# Patient Record
Sex: Male | Born: 1940 | Race: White | Hispanic: No | Marital: Married | Smoking: Never smoker
Health system: Southern US, Community
[De-identification: ages and names within clinical notes are randomized; demographics above are authoritative.]

## PROBLEM LIST (undated history)

## (undated) DIAGNOSIS — N289 Disorder of kidney and ureter, unspecified: Secondary | ICD-10-CM

## (undated) DIAGNOSIS — K5792 Diverticulitis of intestine, part unspecified, without perforation or abscess without bleeding: Secondary | ICD-10-CM

## (undated) HISTORY — PX: COLON RESECTION: SHX5231

## (undated) HISTORY — PX: COLON SURGERY: SHX602

## (undated) HISTORY — PX: CYSTOSCOPY WITH INSERTION OF UROLIFT: SHX6678

## (undated) HISTORY — PX: LITHOTRIPSY: SUR834

---

## 2019-12-29 ENCOUNTER — Emergency Department (HOSPITAL_BASED_OUTPATIENT_CLINIC_OR_DEPARTMENT_OTHER): Payer: Medicare Other

## 2019-12-29 ENCOUNTER — Inpatient Hospital Stay (HOSPITAL_BASED_OUTPATIENT_CLINIC_OR_DEPARTMENT_OTHER)
Admission: EM | Admit: 2019-12-29 | Discharge: 2020-01-01 | DRG: 661 | Disposition: A | Discharge status: Home / Self Care | Payer: Medicare Other | Attending: Internal Medicine | Admitting: Internal Medicine

## 2019-12-29 ENCOUNTER — Other Ambulatory Visit: Payer: Self-pay

## 2019-12-29 ENCOUNTER — Encounter (HOSPITAL_BASED_OUTPATIENT_CLINIC_OR_DEPARTMENT_OTHER): Payer: Self-pay

## 2019-12-29 DIAGNOSIS — Z87442 Personal history of urinary calculi: Secondary | ICD-10-CM

## 2019-12-29 DIAGNOSIS — N132 Hydronephrosis with renal and ureteral calculous obstruction: Secondary | ICD-10-CM | POA: Diagnosis not present

## 2019-12-29 DIAGNOSIS — I129 Hypertensive chronic kidney disease with stage 1 through stage 4 chronic kidney disease, or unspecified chronic kidney disease: Secondary | ICD-10-CM | POA: Diagnosis present

## 2019-12-29 DIAGNOSIS — N281 Cyst of kidney, acquired: Secondary | ICD-10-CM | POA: Diagnosis present

## 2019-12-29 DIAGNOSIS — Z20822 Contact with and (suspected) exposure to covid-19: Secondary | ICD-10-CM | POA: Diagnosis present

## 2019-12-29 DIAGNOSIS — N200 Calculus of kidney: Secondary | ICD-10-CM

## 2019-12-29 DIAGNOSIS — N1330 Unspecified hydronephrosis: Secondary | ICD-10-CM

## 2019-12-29 DIAGNOSIS — I10 Essential (primary) hypertension: Secondary | ICD-10-CM | POA: Diagnosis present

## 2019-12-29 DIAGNOSIS — Z79899 Other long term (current) drug therapy: Secondary | ICD-10-CM

## 2019-12-29 DIAGNOSIS — N184 Chronic kidney disease, stage 4 (severe): Secondary | ICD-10-CM | POA: Diagnosis present

## 2019-12-29 DIAGNOSIS — N179 Acute kidney failure, unspecified: Secondary | ICD-10-CM | POA: Diagnosis present

## 2019-12-29 DIAGNOSIS — N400 Enlarged prostate without lower urinary tract symptoms: Secondary | ICD-10-CM | POA: Diagnosis present

## 2019-12-29 HISTORY — DX: Diverticulitis of intestine, part unspecified, without perforation or abscess without bleeding: K57.92

## 2019-12-29 HISTORY — DX: Disorder of kidney and ureter, unspecified: N28.9

## 2019-12-29 LAB — URINALYSIS, MICROSCOPIC (REFLEX)

## 2019-12-29 LAB — URINALYSIS, ROUTINE W REFLEX MICROSCOPIC
Bilirubin Urine: NEGATIVE
Glucose, UA: NEGATIVE mg/dL
Ketones, ur: NEGATIVE mg/dL
Leukocytes,Ua: NEGATIVE
Nitrite: NEGATIVE
Protein, ur: 100 mg/dL — AB
Specific Gravity, Urine: 1.02 (ref 1.005–1.030)
pH: 6 (ref 5.0–8.0)

## 2019-12-29 LAB — COMPREHENSIVE METABOLIC PANEL
ALT: 12 U/L (ref 0–44)
AST: 16 U/L (ref 15–41)
Albumin: 3.7 g/dL (ref 3.5–5.0)
Alkaline Phosphatase: 90 U/L (ref 38–126)
Anion gap: 14 (ref 5–15)
BUN: 45 mg/dL — ABNORMAL HIGH (ref 8–23)
CO2: 22 mmol/L (ref 22–32)
Calcium: 9.4 mg/dL (ref 8.9–10.3)
Chloride: 99 mmol/L (ref 98–111)
Creatinine, Ser: 4.73 mg/dL — ABNORMAL HIGH (ref 0.61–1.24)
GFR, Estimated: 12 mL/min — ABNORMAL LOW (ref >60)
Glucose, Bld: 119 mg/dL — ABNORMAL HIGH (ref 70–99)
Potassium: 3.7 mmol/L (ref 3.5–5.1)
Sodium: 135 mmol/L (ref 135–145)
Total Bilirubin: 0.5 mg/dL (ref 0.3–1.2)
Total Protein: 7.8 g/dL (ref 6.5–8.1)

## 2019-12-29 LAB — CBC
HCT: 34.6 % — ABNORMAL LOW (ref 39.0–52.0)
Hemoglobin: 11.1 g/dL — ABNORMAL LOW (ref 13.0–17.0)
MCH: 26.6 pg (ref 26.0–34.0)
MCHC: 32.1 g/dL (ref 30.0–36.0)
MCV: 83 fL (ref 80.0–100.0)
Platelets: 407 10*3/uL — ABNORMAL HIGH (ref 150–400)
RBC: 4.17 MIL/uL — ABNORMAL LOW (ref 4.22–5.81)
RDW: 14.3 % (ref 11.5–15.5)
WBC: 15.4 10*3/uL — ABNORMAL HIGH (ref 4.0–10.5)
nRBC: 0 % (ref 0.0–0.2)

## 2019-12-29 LAB — LIPASE, BLOOD: Lipase: 15 U/L (ref 11–51)

## 2019-12-29 MED ORDER — SODIUM CHLORIDE 0.9 % IV BOLUS
1000.0000 mL | Freq: Once | INTRAVENOUS | Status: AC
  Administered 2019-12-29: 1000 mL via INTRAVENOUS

## 2019-12-29 MED ORDER — MORPHINE SULFATE (PF) 4 MG/ML IV SOLN
4.0000 mg | Freq: Once | INTRAVENOUS | Status: AC
  Administered 2019-12-29: 4 mg via INTRAVENOUS
  Filled 2019-12-29: qty 1

## 2019-12-29 MED ORDER — ONDANSETRON HCL 4 MG/2ML IJ SOLN
4.0000 mg | Freq: Once | INTRAMUSCULAR | Status: AC
  Administered 2019-12-29: 4 mg via INTRAVENOUS
  Filled 2019-12-29: qty 2

## 2019-12-29 NOTE — ED Provider Notes (Signed)
[facility] Provider Note   CSN: [id number] Arrival date & time: 12/29/19  2039     History Chief Complaint  Patient presents with  . Abdominal Pain    Leslie Reese is a 79 y.o. male.  79 yo M with a chief complaints of right-sided abdominal pain.  Started last night as a dull ache.  Had some nausea and vomiting with it.  Persisted throughout this morning.  Worse with eating.  Radiates from the right flank down into the right lower portion of the abdomen.  No fevers.  Had a loose stool just prior to this because he had been constipated for couple days and took a laxative.  The history is provided by the patient.  Abdominal Pain Pain location:  R flank Pain quality: sharp and shooting   Pain radiates to:  Does not radiate Pain severity:  Moderate Onset quality:  Gradual Duration:  1 day Timing:  Constant Progression:  Worsening Chronicity:  New Context: eating   Relieved by:  Nothing Worsened by:  Eating Ineffective treatments:  None tried Associated symptoms: nausea and vomiting   Associated symptoms: no chest pain, no chills, no diarrhea, no fever and no shortness of breath        Past Medical History:  Diagnosis Date  . Diverticulitis   . Renal disorder    kidney stones    There are no problems to display for this patient.   Past Surgical History:  Procedure Laterality Date  . COLON RESECTION         History reviewed. No pertinent family history.  Social History   Tobacco Use  . Smoking status: Never Smoker  Substance Use Topics  . Alcohol use: Never  . Drug use: Never    Home Medications Prior to Admission medications   Medication Sig Start Date End Date Taking? Authorizing Provider  amLODipine (NORVASC) 5 MG tablet Take by mouth.   Yes [provider]  calcitRIOL (ROCALTROL) 0.25 MCG capsule Take 0.25 mcg by mouth daily. Mon, Wed, Friday   Yes [provider]  tamsulosin (FLOMAX) 0.4 MG CAPS  capsule Take by mouth.   Yes [provider]  torsemide (DEMADEX) 20 MG tablet Take 20 mg by mouth daily.    [provider]    Allergies    Patient has no known allergies.  Review of Systems   Review of Systems  Constitutional: Negative for chills and fever.  HENT: Negative for congestion and facial swelling.   Eyes: Negative for discharge and visual disturbance.  Respiratory: Negative for shortness of breath.   Cardiovascular: Negative for chest pain and palpitations.  Gastrointestinal: Positive for abdominal pain, nausea and vomiting. Negative for diarrhea.  Musculoskeletal: Negative for arthralgias and myalgias.  Skin: Negative for color change and rash.  Neurological: Negative for tremors, syncope and headaches.  Psychiatric/Behavioral: Negative for confusion and dysphoric mood.    Physical Exam Updated Vital Signs BP (!) 152/85   Pulse 77   Temp 97.6 F (36.4 C) (Oral)   Resp 18   Ht 5\' 11"  (1.803 m)   Wt 86.2 kg   SpO2 98%   BMI 26.50 kg/m   Physical Exam Vitals and nursing note reviewed.  Constitutional:      Appearance: He is well-developed and well-nourished.  HENT:     Head: Normocephalic and atraumatic.  Eyes:     Extraocular Movements: EOM normal.     Pupils: Pupils are equal, round, and reactive to light.  Neck:     Vascular: No JVD.  Cardiovascular:     Rate and Rhythm: Normal rate and regular rhythm.     Heart sounds: No murmur heard. No friction rub. No gallop.   Pulmonary:     Effort: No respiratory distress.     Breath sounds: No wheezing.  Abdominal:     General: There is no distension.     Tenderness: There is no abdominal tenderness. There is no guarding or rebound.     Comments: No significant pain to the abdomen.   Musculoskeletal:        General: Normal range of motion.     Cervical back: Normal range of motion and neck supple.  Skin:    Coloration: Skin is not pale.     Findings: No rash.  Neurological:      Mental Status: He is alert and oriented to person, place, and time.  Psychiatric:        Mood and Affect: Mood and affect normal.        Behavior: Behavior normal.     ED Results / Procedures / Treatments   Labs (all labs ordered are listed, but only abnormal results are displayed) Labs Reviewed  COMPREHENSIVE METABOLIC PANEL - Abnormal; Notable for the following components:      Result Value   Glucose, Bld 119 (*)    BUN 45 (*)    Creatinine, Ser 4.73 (*)    GFR, Estimated 12 (*)    All other components within normal limits  CBC - Abnormal; Notable for the following components:   WBC 15.4 (*)    RBC 4.17 (*)    Hemoglobin 11.1 (*)    HCT 34.6 (*)    Platelets 407 (*)    All other components within normal limits  URINALYSIS, ROUTINE W REFLEX MICROSCOPIC - Abnormal; Notable for the following components:   APPearance HAZY (*)    Hgb urine dipstick SMALL (*)    Protein, ur 100 (*)    All other components within normal limits  URINALYSIS, MICROSCOPIC (REFLEX) - Abnormal; Notable for the following components:   Bacteria, UA FEW (*)    All other components within normal limits  RESP PANEL BY RT-PCR (FLU A&B, COVID) ARPGX2  LIPASE, BLOOD    EKG None  Radiology CT ABDOMEN PELVIS WO CONTRAST  Result Date: 12/29/2019 CLINICAL DATA:  Right-sided abdominal pain. EXAM: CT ABDOMEN AND PELVIS WITHOUT CONTRAST TECHNIQUE: Multidetector CT imaging of the abdomen and pelvis was performed following the standard protocol without IV contrast. COMPARISON:  August 24, 2014 FINDINGS: Lower chest: No acute abnormality. Hepatobiliary: No focal liver abnormality is seen. No gallstones, gallbladder wall thickening, or biliary dilatation. Pancreas: Unremarkable. No pancreatic ductal dilatation or surrounding inflammatory changes. Spleen: Normal in size without focal abnormality. Adrenals/Urinary Tract: Adrenal glands are unremarkable. Kidneys are normal in size. A 1.7 cm x 1.2 cm cyst is seen along the  anterolateral aspect of the mid right kidney. An additional 1.1 cm x 0.8 cm cyst is seen along the anterior aspect of the mid left kidney. A 3 mm obstructing renal stone is seen at the right UVJ. Moderate severity right-sided hydronephrosis and hydroureter are noted. Bladder is unremarkable. Stomach/Bowel: There is a small hiatal hernia. The appendix is not clearly identified. No evidence of bowel wall thickening, distention, or inflammatory changes. Noninflamed diverticula are seen throughout the large bowel. Vascular/Lymphatic: Aortic atherosclerosis. No enlarged abdominal or pelvic lymph nodes. 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 Other: No abdominal wall hernia or abnormality. No abdominopelvic ascites. Musculoskeletal: There is grade 1 anterolisthesis of the L5 vertebral body on S1. IMPRESSION: 1. 3 mm obstructing renal stone at the right UVJ. 2. Small hiatal hernia. 3. Colonic diverticulosis. 4. 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 5. 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 6. Aortic atherosclerosis. Aortic Atherosclerosis (ICD10-I70.0). Electronically Signed   By: Virgina Norfolk M.D.   On: 12/29/2019 23:34    Procedures Procedures (including critical care time)  Medications Ordered in ED Medications  morphine 4 MG/ML injection 4 mg (4 mg Intravenous Given 12/29/19 2252)  ondansetron (ZOFRAN) injection 4 mg (4 mg Intravenous Given 12/29/19 2251)  sodium chloride 0.9 % bolus 1,000 mL (1,000 mLs Intravenous New Bag/Given 12/29/19 2254)    ED Course  I have reviewed the triage vital signs and the nursing notes.  Pertinent labs & imaging results that were available during my care of the patient were reviewed by me and considered in my medical decision making (see chart for details).    MDM Rules/Calculators/A&P                          79 yo M with a cc of R sided abdominal pain.  Emesis last night.  No fevers.   Labwork with aki.  Will give bolus pain meds, CT.     Lab work with an acute kidney injury.  Looks like his baseline creatinine as far as I can see is usually about 1.8 or 9.  Creatinine today is greater than 4.  I discussed this with the patient.  He does concur that is higher than his normal.  CT scan viewed by me with right-sided hydronephrosis.  Stone noted at the UVJ.  Patient care signed out to Dr. Laverta Baltimore, please see his note for further details care in the ED.  Likely with AKI and obstructive uropathy will require admission.  The patients results and plan were reviewed and discussed.   Any x-rays performed were independently reviewed by myself.   Differential diagnosis were considered with the presenting HPI.  Medications  morphine 4 MG/ML injection 4 mg (4 mg Intravenous Given 12/29/19 2252)  ondansetron (ZOFRAN) injection 4 mg (4 mg Intravenous Given 12/29/19 2251)  sodium chloride 0.9 % bolus 1,000 mL (1,000 mLs Intravenous New Bag/Given 12/29/19 2254)    Vitals:   12/29/19 2050 12/29/19 2054 12/29/19 2152 12/29/19 2325  BP: 131/88   (!) 152/85  Pulse: 84   77  Resp: 18   18  Temp:   98.4 F (36.9 C) 97.6 F (36.4 C)  TempSrc:   Oral Oral  SpO2: 98%   98%  Weight:  86.2 kg    Height:  5\' 11"  (1.803 m)      Final diagnoses:  AKI (acute kidney injury) ([place])  Nephrolithiasis    Admission/ observation were discussed with the admitting physician, patient and/or family and they are comfortable with the plan.   Final Clinical Impression(s) / ED Diagnoses Final diagnoses:  AKI (acute kidney injury) ([place])  Nephrolithiasis    Rx / DC Orders ED Discharge Orders    None       Deno Etienne, DO 12/29/19 2343

## 2019-12-29 NOTE — ED Triage Notes (Signed)
Pt complaining of right abdomen and flank pain since yesterday. Hx of kidney stones. States ate large meal last night and started after. Emesis x 3 last night. Pain resolved this am, then returned after eating. Hx of colon resection & diverticulitis. Last normal BM 12/24.

## 2019-12-30 ENCOUNTER — Encounter (HOSPITAL_COMMUNITY)
Admission: EM | Disposition: A | Discharge status: Home / Self Care | Payer: Self-pay | Source: Home / Self Care | Attending: Internal Medicine

## 2019-12-30 ENCOUNTER — Inpatient Hospital Stay (HOSPITAL_COMMUNITY): Payer: Medicare Other

## 2019-12-30 ENCOUNTER — Inpatient Hospital Stay (HOSPITAL_COMMUNITY): Payer: Medicare Other | Admitting: Anesthesiology

## 2019-12-30 ENCOUNTER — Encounter (HOSPITAL_COMMUNITY): Payer: Self-pay | Admitting: Family Medicine

## 2019-12-30 DIAGNOSIS — N281 Cyst of kidney, acquired: Secondary | ICD-10-CM | POA: Diagnosis not present

## 2019-12-30 DIAGNOSIS — N184 Chronic kidney disease, stage 4 (severe): Secondary | ICD-10-CM | POA: Diagnosis not present

## 2019-12-30 DIAGNOSIS — Z79899 Other long term (current) drug therapy: Secondary | ICD-10-CM | POA: Diagnosis not present

## 2019-12-30 DIAGNOSIS — I129 Hypertensive chronic kidney disease with stage 1 through stage 4 chronic kidney disease, or unspecified chronic kidney disease: Secondary | ICD-10-CM | POA: Diagnosis not present

## 2019-12-30 DIAGNOSIS — Z87442 Personal history of urinary calculi: Secondary | ICD-10-CM | POA: Diagnosis not present

## 2019-12-30 DIAGNOSIS — N179 Acute kidney failure, unspecified: Secondary | ICD-10-CM | POA: Diagnosis present

## 2019-12-30 DIAGNOSIS — N400 Enlarged prostate without lower urinary tract symptoms: Secondary | ICD-10-CM | POA: Diagnosis not present

## 2019-12-30 DIAGNOSIS — N132 Hydronephrosis with renal and ureteral calculous obstruction: Secondary | ICD-10-CM | POA: Diagnosis not present

## 2019-12-30 DIAGNOSIS — I10 Essential (primary) hypertension: Secondary | ICD-10-CM | POA: Diagnosis not present

## 2019-12-30 DIAGNOSIS — Z20822 Contact with and (suspected) exposure to covid-19: Secondary | ICD-10-CM | POA: Diagnosis not present

## 2019-12-30 DIAGNOSIS — N200 Calculus of kidney: Secondary | ICD-10-CM | POA: Diagnosis present

## 2019-12-30 HISTORY — PX: CYSTOSCOPY WITH RETROGRADE PYELOGRAM, URETEROSCOPY AND STENT PLACEMENT: SHX5789

## 2019-12-30 LAB — RESP PANEL BY RT-PCR (FLU A&B, COVID) ARPGX2
Influenza A by PCR: NEGATIVE
Influenza B by PCR: NEGATIVE
SARS Coronavirus 2 by RT PCR: NEGATIVE

## 2019-12-30 SURGERY — CYSTOURETEROSCOPY, WITH RETROGRADE PYELOGRAM AND STENT INSERTION
Anesthesia: General | Laterality: Right

## 2019-12-30 MED ORDER — DEXAMETHASONE SODIUM PHOSPHATE 10 MG/ML IJ SOLN
INTRAMUSCULAR | Status: AC
  Filled 2019-12-30: qty 1

## 2019-12-30 MED ORDER — CEFAZOLIN SODIUM-DEXTROSE 2-4 GM/100ML-% IV SOLN
INTRAVENOUS | Status: AC
  Filled 2019-12-30: qty 100

## 2019-12-30 MED ORDER — PROPOFOL 10 MG/ML IV BOLUS
INTRAVENOUS | Status: AC
  Filled 2019-12-30: qty 20

## 2019-12-30 MED ORDER — FENTANYL CITRATE (PF) 250 MCG/5ML IJ SOLN
INTRAMUSCULAR | Status: AC
  Filled 2019-12-30: qty 5

## 2019-12-30 MED ORDER — DEXAMETHASONE SODIUM PHOSPHATE 10 MG/ML IJ SOLN
INTRAMUSCULAR | Status: DC | PRN
  Administered 2019-12-30: 10 mg via INTRAVENOUS

## 2019-12-30 MED ORDER — 0.9 % SODIUM CHLORIDE (POUR BTL) OPTIME
TOPICAL | Status: DC | PRN
  Administered 2019-12-30: 1000 mL

## 2019-12-30 MED ORDER — PROPOFOL 10 MG/ML IV BOLUS
INTRAVENOUS | Status: DC | PRN
  Administered 2019-12-30: 150 mg via INTRAVENOUS

## 2019-12-30 MED ORDER — MORPHINE SULFATE (PF) 4 MG/ML IV SOLN
4.0000 mg | Freq: Once | INTRAVENOUS | Status: AC
  Administered 2019-12-30: 4 mg via INTRAVENOUS
  Filled 2019-12-30: qty 1

## 2019-12-30 MED ORDER — ONDANSETRON HCL 4 MG/2ML IJ SOLN
INTRAMUSCULAR | Status: AC
  Filled 2019-12-30: qty 2

## 2019-12-30 MED ORDER — AMLODIPINE BESYLATE 5 MG PO TABS
5.0000 mg | ORAL_TABLET | Freq: Every day | ORAL | Status: DC
Start: 2019-12-31 — End: 2020-01-01
  Administered 2019-12-31 – 2020-01-01 (×2): 5 mg via ORAL
  Filled 2019-12-30 (×2): qty 1

## 2019-12-30 MED ORDER — FENTANYL CITRATE (PF) 100 MCG/2ML IJ SOLN
INTRAMUSCULAR | Status: AC
  Filled 2019-12-30: qty 2

## 2019-12-30 MED ORDER — MORPHINE SULFATE (PF) 2 MG/ML IV SOLN: 2.0000 mg | INTRAVENOUS | Status: DC | PRN

## 2019-12-30 MED ORDER — TORSEMIDE 20 MG PO TABS
20.0000 mg | ORAL_TABLET | Freq: Every day | ORAL | Status: DC
  Administered 2019-12-31: 20 mg via ORAL
  Filled 2019-12-30: qty 1

## 2019-12-30 MED ORDER — EPHEDRINE SULFATE-NACL 50-0.9 MG/10ML-% IV SOSY
PREFILLED_SYRINGE | INTRAVENOUS | Status: DC | PRN
  Administered 2019-12-30: 10 mg via INTRAVENOUS

## 2019-12-30 MED ORDER — TAMSULOSIN HCL 0.4 MG PO CAPS
0.4000 mg | ORAL_CAPSULE | Freq: Every day | ORAL | Status: DC
  Administered 2019-12-31 – 2020-01-01 (×2): 0.4 mg via ORAL
  Filled 2019-12-30 (×2): qty 1

## 2019-12-30 MED ORDER — CEFAZOLIN SODIUM-DEXTROSE 2-4 GM/100ML-% IV SOLN
2.0000 g | Freq: Once | INTRAVENOUS | Status: AC
  Administered 2019-12-30: 2 g via INTRAVENOUS

## 2019-12-30 MED ORDER — LIDOCAINE HCL (PF) 2 % IJ SOLN
INTRAMUSCULAR | Status: AC
  Filled 2019-12-30: qty 5

## 2019-12-30 MED ORDER — ACETAMINOPHEN 325 MG PO TABS: 650.0000 mg | ORAL_TABLET | Freq: Four times a day (QID) | ORAL | Status: DC | PRN

## 2019-12-30 MED ORDER — ACETAMINOPHEN 650 MG RE SUPP: 650.0000 mg | Freq: Four times a day (QID) | RECTAL | Status: DC | PRN

## 2019-12-30 MED ORDER — CHLORHEXIDINE GLUCONATE 0.12 % MT SOLN
15.0000 mL | OROMUCOSAL | Status: AC
  Administered 2019-12-30: 15 mL via OROMUCOSAL

## 2019-12-30 MED ORDER — IOHEXOL 300 MG/ML  SOLN
INTRAMUSCULAR | Status: DC | PRN
  Administered 2019-12-30: 12 mL

## 2019-12-30 MED ORDER — SODIUM CHLORIDE 0.9 % IV SOLN: INTRAVENOUS | Status: DC

## 2019-12-30 MED ORDER — MIDAZOLAM HCL 2 MG/2ML IJ SOLN
INTRAMUSCULAR | Status: AC
  Filled 2019-12-30: qty 2

## 2019-12-30 MED ORDER — SODIUM CHLORIDE 0.9 % IR SOLN
Status: DC | PRN
  Administered 2019-12-30: 3000 mL

## 2019-12-30 MED ORDER — TAMSULOSIN HCL 0.4 MG PO CAPS
0.4000 mg | ORAL_CAPSULE | Freq: Once | ORAL | Status: AC
  Administered 2019-12-30: 0.4 mg via ORAL
  Filled 2019-12-30: qty 1

## 2019-12-30 MED ORDER — LIDOCAINE 2% (20 MG/ML) 5 ML SYRINGE
INTRAMUSCULAR | Status: DC | PRN
  Administered 2019-12-30: 100 mg via INTRAVENOUS

## 2019-12-30 MED ORDER — ONDANSETRON HCL 4 MG/2ML IJ SOLN: 4.0000 mg | Freq: Four times a day (QID) | INTRAMUSCULAR | Status: DC | PRN

## 2019-12-30 MED ORDER — ONDANSETRON HCL 4 MG/2ML IJ SOLN: 4.0000 mg | Freq: Once | INTRAMUSCULAR | Status: DC | PRN

## 2019-12-30 MED ORDER — OXYCODONE HCL 5 MG PO TABS: 5.0000 mg | ORAL_TABLET | Freq: Once | ORAL | Status: DC | PRN

## 2019-12-30 MED ORDER — FENTANYL CITRATE (PF) 100 MCG/2ML IJ SOLN
25.0000 ug | INTRAMUSCULAR | Status: DC | PRN
Start: 2019-12-30 — End: 2019-12-30

## 2019-12-30 MED ORDER — OXYCODONE HCL 5 MG/5ML PO SOLN: 5.0000 mg | Freq: Once | ORAL | Status: DC | PRN

## 2019-12-30 SURGICAL SUPPLY — 21 items
BAG URO CATCHER STRL LF (MISCELLANEOUS) ×2 IMPLANT
BASKET ZERO TIP NITINOL 2.4FR (BASKET) IMPLANT
CATH URET 5FR 28IN OPEN ENDED (CATHETERS) ×2 IMPLANT
CLOTH BEACON ORANGE TIMEOUT ST (SAFETY) ×2 IMPLANT
FIBER LASER MOSES 200 DFL (Laser) IMPLANT
GLOVE BIOGEL M 7.0 STRL (GLOVE) ×2 IMPLANT
GOWN STRL REUS W/TWL LRG LVL3 (GOWN DISPOSABLE) ×2 IMPLANT
GUIDEWIRE STR DUAL SENSOR (WIRE) ×4 IMPLANT
GUIDEWIRE ZIPWRE .038 STRAIGHT (WIRE) IMPLANT
IV NS 1000ML (IV SOLUTION) ×1
IV NS 1000ML BAXH (IV SOLUTION) ×1 IMPLANT
KIT TURNOVER KIT A (KITS) IMPLANT
LASER FIB FLEXIVA PULSE ID 365 (Laser) IMPLANT
MANIFOLD NEPTUNE II (INSTRUMENTS) ×2 IMPLANT
PACK CYSTO (CUSTOM PROCEDURE TRAY) ×2 IMPLANT
SHEATH URETERAL 12FRX35CM (MISCELLANEOUS) IMPLANT
STENT URET 6FRX26 CONTOUR (STENTS) ×2 IMPLANT
TRACTIP FLEXIVA PULS ID 200XHI (Laser) IMPLANT
TRACTIP FLEXIVA PULSE ID 200 (Laser)
TUBING CONNECTING 10 (TUBING) ×2 IMPLANT
TUBING UROLOGY SET (TUBING) ×2 IMPLANT

## 2019-12-30 NOTE — Anesthesia Preprocedure Evaluation (Signed)
Anesthesia Evaluation  Patient identified by MRN, date of birth, ID band Patient awake    Reviewed: Allergy & Precautions, H&P , NPO status , Patient's Chart, lab work & pertinent test results  Airway Mallampati: II  TM Distance: >3 FB Neck ROM: Full    Dental no notable dental hx.    Pulmonary neg pulmonary ROS,    Pulmonary exam normal breath sounds clear to auscultation       Cardiovascular negative cardio ROS Normal cardiovascular exam Rhythm:Regular Rate:Normal     Neuro/Psych negative neurological ROS  negative psych ROS   GI/Hepatic negative GI ROS, Neg liver ROS,   Endo/Other  negative endocrine ROS  Renal/GU Renal InsufficiencyRenal disease  negative genitourinary   Musculoskeletal negative musculoskeletal ROS (+)   Abdominal   Peds negative pediatric ROS (+)  Hematology  (+) anemia ,   Anesthesia Other Findings   Reproductive/Obstetrics negative OB ROS                             Anesthesia Physical Anesthesia Plan  ASA: III  Anesthesia Plan: General   Post-op Pain Management:    Induction: Intravenous  PONV Risk Score and Plan: 2 and Ondansetron, Dexamethasone and Treatment may vary due to age or medical condition  Airway Management Planned: LMA  Additional Equipment:   Intra-op Plan:   Post-operative Plan: Extubation in OR  Informed Consent: I have reviewed the patients History and Physical, chart, labs and discussed the procedure including the risks, benefits and alternatives for the proposed anesthesia with the patient or authorized representative who has indicated his/her understanding and acceptance.     Dental advisory given  Plan Discussed with: CRNA and Surgeon  Anesthesia Plan Comments:         Anesthesia Quick Evaluation

## 2019-12-30 NOTE — Consult Note (Signed)
Urology Consult   Physician requesting consult: Nanda Quinton, MD  Reason for consult: Right ureteral stone, AKI  History of Present Illness: Alcide Memoli is a 79 y.o. male who presented to the ED with acute onset of right flank pain.  He denies fevers or chills.  He denies dysuria or hematuria.  He has some urgency but no frequency.  He does have a history of urolithiasis and underwent ESWL in [place] by Dr. Harlow Asa about a month ago.   CT A/P 12/29/2019 revealed a 3 mm obstructing renal stone at the right UVJ with severe right-sided hydronephrosis.  Also evident or bilateral simple appearing renal cyst.  He is afebrile, white blood cell count 15.4, urinalysis without sign of infection, urine culture pending, creatinine 4.73 from an unknown baseline.   Past Medical History:  Diagnosis Date  . Diverticulitis   . Renal disorder    kidney stones    Past Surgical History:  Procedure Laterality Date  . COLON RESECTION      Current Hospital Medications:  Home Meds:  No current facility-administered medications on file prior to encounter.   Current Outpatient Medications on File Prior to Encounter  Medication Sig Dispense Refill  . albuterol (VENTOLIN HFA) 108 (90 Base) MCG/ACT inhaler Inhale 1-2 puffs into the lungs every 6 (six) hours as needed for wheezing or shortness of breath.    Marland Kitchen amLODipine (NORVASC) 5 MG tablet Take by mouth.    . calcitRIOL (ROCALTROL) 0.25 MCG capsule Take 0.25 mcg by mouth daily. Mon, Wed, Friday    . CALCIUM ASCORBATE PO Take 1 tablet by mouth. Every Monday, Wednesday, Friday    . tamsulosin (FLOMAX) 0.4 MG CAPS capsule Take by mouth.    . torsemide (DEMADEX) 20 MG tablet Take 20 mg by mouth daily.       Scheduled Meds: Continuous Infusions: PRN Meds:.acetaminophen, morphine injection, ondansetron (ZOFRAN) IV  Allergies: No Known Allergies  History reviewed. No pertinent family history.  Social History:  reports that he has never smoked. He  does not have any smokeless tobacco history on file. He reports that he does not drink alcohol and does not use drugs.  ROS: A complete review of systems was performed.  All systems are negative except for pertinent findings as noted.  Physical Exam:  Vital signs in last 24 hours: Temp:  [97.6 F (36.4 C)-98.4 F (36.9 C)] 98.2 F (36.8 C) (12/27 0323) Pulse Rate:  [63-84] 66 (12/27 0323) Resp:  [18] 18 (12/27 0323) BP: (128-161)/(81-94) 132/81 (12/27 0323) SpO2:  [93 %-99 %] 95 % (12/27 0323) Weight:  [86.2 kg] 86.2 kg (12/26 2054) Constitutional:  Alert and oriented, No acute distress Cardiovascular: Regular rate and rhythm Respiratory: Normal respiratory effort, Lungs clear bilaterally GI: Abdomen is soft, nontender, nondistended, no abdominal masses GU: No CVA tenderness Neurologic: Grossly intact, no focal deficits Psychiatric: Normal mood and affect  Laboratory Data:  Recent Labs    12/29/19 2101  WBC 15.4*  HGB 11.1*  HCT 34.6*  PLT 407*    Recent Labs    12/29/19 2101  NA 135  K 3.7  CL 99  GLUCOSE 119*  BUN 45*  CALCIUM 9.4  CREATININE 4.73*     Results for orders placed or performed during the hospital encounter of 12/29/19 (from the past 24 hour(s))  Lipase, blood     Status: None   Collection Time: 12/29/19  9:01 PM  Result Value Ref Range   Lipase 15 11 - 51 U/L  Comprehensive metabolic panel     Status: Abnormal   Collection Time: 12/29/19  9:01 PM  Result Value Ref Range   Sodium 135 135 - 145 mmol/L   Potassium 3.7 3.5 - 5.1 mmol/L   Chloride 99 98 - 111 mmol/L   CO2 22 22 - 32 mmol/L   Glucose, Bld 119 (H) 70 - 99 mg/dL   BUN 45 (H) 8 - 23 mg/dL   Creatinine, Ser 4.73 (H) 0.61 - 1.24 mg/dL   Calcium 9.4 8.9 - 10.3 mg/dL   Total Protein 7.8 6.5 - 8.1 g/dL   Albumin 3.7 3.5 - 5.0 g/dL   AST 16 15 - 41 U/L   ALT 12 0 - 44 U/L   Alkaline Phosphatase 90 38 - 126 U/L   Total Bilirubin 0.5 0.3 - 1.2 mg/dL   GFR, Estimated 12 (L) >60  mL/min   Anion gap 14 5 - 15  CBC     Status: Abnormal   Collection Time: 12/29/19  9:01 PM  Result Value Ref Range   WBC 15.4 (H) 4.0 - 10.5 K/uL   RBC 4.17 (L) 4.22 - 5.81 MIL/uL   Hemoglobin 11.1 (L) 13.0 - 17.0 g/dL   HCT 34.6 (L) 39.0 - 52.0 %   MCV 83.0 80.0 - 100.0 fL   MCH 26.6 26.0 - 34.0 pg   MCHC 32.1 30.0 - 36.0 g/dL   RDW 14.3 11.5 - 15.5 %   Platelets 407 (H) 150 - 400 K/uL   nRBC 0.0 0.0 - 0.2 %  Urinalysis, Routine w reflex microscopic Urine, Clean Catch     Status: Abnormal   Collection Time: 12/29/19  9:01 PM  Result Value Ref Range   Color, Urine YELLOW YELLOW   APPearance HAZY (A) CLEAR   Specific Gravity, Urine 1.020 1.005 - 1.030   pH 6.0 5.0 - 8.0   Glucose, UA NEGATIVE NEGATIVE mg/dL   Hgb urine dipstick SMALL (A) NEGATIVE   Bilirubin Urine NEGATIVE NEGATIVE   Ketones, ur NEGATIVE NEGATIVE mg/dL   Protein, ur 100 (A) NEGATIVE mg/dL   Nitrite NEGATIVE NEGATIVE   Leukocytes,Ua NEGATIVE NEGATIVE  Urinalysis, Microscopic (reflex)     Status: Abnormal   Collection Time: 12/29/19  9:01 PM  Result Value Ref Range   RBC / HPF 0-5 0 - 5 RBC/hpf   WBC, UA 0-5 0 - 5 WBC/hpf   Bacteria, UA FEW (A) NONE SEEN   Squamous Epithelial / LPF 0-5 0 - 5   Mucus PRESENT    Hyaline Casts, UA PRESENT    Granular Casts, UA PRESENT   Resp Panel by RT-PCR (Flu A&B, Covid) Nasopharyngeal Swab     Status: None   Collection Time: 12/29/19 11:14 PM   Specimen: Nasopharyngeal Swab; Nasopharyngeal(NP) swabs in vial transport medium  Result Value Ref Range   SARS Coronavirus 2 by RT PCR NEGATIVE NEGATIVE   Influenza A by PCR NEGATIVE NEGATIVE   Influenza B by PCR NEGATIVE NEGATIVE   Recent Results (from the past 240 hour(s))  Resp Panel by RT-PCR (Flu A&B, Covid) Nasopharyngeal Swab     Status: None   Collection Time: 12/29/19 11:14 PM   Specimen: Nasopharyngeal Swab; Nasopharyngeal(NP) swabs in vial transport medium  Result Value Ref Range Status   SARS Coronavirus 2 by RT  PCR NEGATIVE NEGATIVE Final    Comment: (NOTE) SARS-CoV-2 target nucleic acids are NOT DETECTED.  The SARS-CoV-2 RNA is generally detectable in upper respiratory specimens during the acute phase of  infection. The lowest concentration of SARS-CoV-2 viral copies this assay can detect is 138 copies/mL. A negative result does not preclude SARS-Cov-2 infection and should not be used as the sole basis for treatment or other patient management decisions. A negative result may occur with  improper specimen collection/handling, submission of specimen other than nasopharyngeal swab, presence of viral mutation(s) within the areas targeted by this assay, and inadequate number of viral copies(<138 copies/mL). A negative result must be combined with clinical observations, patient history, and epidemiological information. The expected result is Negative.  Fact Sheet for Patients:  EntrepreneurPulse.com.au  Fact Sheet for Healthcare Providers:  IncredibleEmployment.be  This test is no t yet approved or cleared by the [place] FDA and  has been authorized for detection and/or diagnosis of SARS-CoV-2 by FDA under an Emergency Use Authorization (EUA). This EUA will remain  in effect (meaning this test can be used) for the duration of the COVID-19 declaration under Section 564(b)(1) of the Act, 21 U.S.C.section 360bbb-3(b)(1), unless the authorization is terminated  or revoked sooner.       Influenza A by PCR NEGATIVE NEGATIVE Final   Influenza B by PCR NEGATIVE NEGATIVE Final    Comment: (NOTE) The Xpert Xpress SARS-CoV-2/FLU/RSV plus assay is intended as an aid in the diagnosis of influenza from Nasopharyngeal swab specimens and should not be used as a sole basis for treatment. Nasal washings and aspirates are unacceptable for Xpert Xpress SARS-CoV-2/FLU/RSV testing.  Fact Sheet for Patients: EntrepreneurPulse.com.au  Fact Sheet for  Healthcare Providers: IncredibleEmployment.be  This test is not yet approved or cleared by the [place] FDA and has been authorized for detection and/or diagnosis of SARS-CoV-2 by FDA under an Emergency Use Authorization (EUA). This EUA will remain in effect (meaning this test can be used) for the duration of the COVID-19 declaration under Section 564(b)(1) of the Act, 21 U.S.C. section 360bbb-3(b)(1), unless the authorization is terminated or revoked.  Performed at [facility], [address], [place], [place] [address]     Renal Function: Recent Labs    12/29/19 2101  CREATININE 4.73*   Estimated Creatinine Clearance: 13.9 mL/min (A) (by C-G formula based on SCr of 4.73 mg/dL (H)).  Radiologic Imaging: CT ABDOMEN PELVIS WO CONTRAST  Result Date: 12/29/2019 CLINICAL DATA:  Right-sided abdominal pain. EXAM: CT ABDOMEN AND PELVIS WITHOUT CONTRAST TECHNIQUE: Multidetector CT imaging of the abdomen and pelvis was performed following the standard protocol without IV contrast. COMPARISON:  August 24, 2014 FINDINGS: Lower chest: No acute abnormality. Hepatobiliary: No focal liver abnormality is seen. No gallstones, gallbladder wall thickening, or biliary dilatation. Pancreas: Unremarkable. No pancreatic ductal dilatation or surrounding inflammatory changes. Spleen: Normal in size without focal abnormality. Adrenals/Urinary Tract: Adrenal glands are unremarkable. Kidneys are normal in size. A 1.7 cm x 1.2 cm cyst is seen along the anterolateral aspect of the mid right kidney. An additional 1.1 cm x 0.8 cm cyst is seen along the anterior aspect of the mid left kidney. A 3 mm obstructing renal stone is seen at the right UVJ. Moderate severity right-sided hydronephrosis and hydroureter are noted. Bladder is unremarkable. Stomach/Bowel: There is a small hiatal hernia. The appendix is not clearly identified. No evidence of bowel wall thickening, distention, or  inflammatory changes. Noninflamed diverticula are seen throughout the large bowel. Vascular/Lymphatic: Aortic atherosclerosis. No enlarged abdominal or pelvic lymph nodes. 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 Other: No abdominal wall hernia or abnormality. No abdominopelvic ascites. Musculoskeletal:  There is grade 1 anterolisthesis of the L5 vertebral body on S1. IMPRESSION: 1. 3 mm obstructing renal stone at the right UVJ. 2. Small hiatal hernia. 3. Colonic diverticulosis. 4. 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 5. 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 6. Aortic atherosclerosis. Aortic Atherosclerosis (ICD10-I70.0). Electronically Signed   By: Virgina Norfolk M.D.   On: 12/29/2019 23:34    I independently reviewed the above imaging studies.  Impression/Recommendation 1. Right obstructing ureteral stone: CT A/P 12/29/2019 revealed a 3 mm obstructing renal stone at the right UVJ with severe right-sided hydronephrosis.  Afebrile, WBC 15.4, urinalysis without sign of infection, urine culture pending, creatinine 4.73 from an unknown baseline. 2. AKI: 4.73 from unknown baseline.  Due to obstruction as above. 3. Right hydronephrosis: Due to obstructing right ureteral stone 4. History of urolithiasis: S/p right ESWL about a month ago in [facility].  Bilateral simple appearing renal cysts  -To the OR for cystoscopy, right retrograde pyelogram, right ureteral stent placement, possible right ureteroscopy with laser lithotripsy and basket traction of stone if stone appears to be distal enough at the level of the UVJ. -Add-on to the OR for today -Consented and marked -Ordered Ancef 2 g preop -Admission to medicine given AKI -Will need follow-up with Dr. Harlow Asa with urology in [facility] -The risks, benefits and alternatives of cystoscopy with right JJ stent placement was discussed with the patient.  Risks include, but are not  limited to: bleeding, urinary tract infection, ureteral injury, ureteral stricture disease, chronic pain, urinary symptoms, bladder injury, stent migration, the need for nephrostomy tube placement, MI, CVA, DVT, PE and the inherent risks with general anesthesia.  The patient voices understanding and wishes to proceed.   Matt R. Vyctoria Dickman MD 12/30/2019, 7:25 AM  [facility]  Pager: [phone]   CC: Nanda Quinton, MD

## 2019-12-30 NOTE — Discharge Instructions (Signed)
You have a right ureteral stent draining your kidney. This is temporary and needs to be removed in 3 mo. F/u with urologist in [facility] for definitive treatment of stone.

## 2019-12-30 NOTE — H&P (Signed)
History and Physical    Kotaro Buer ZOX:[id number] DOB: 1940/05/12 DOA: 12/29/2019  PCP: Egbert Garibaldi, PA-C  Patient coming from: Home  Chief Complaint: Flank pain  HPI: Leslie Reese is a 79 y.o. male with medical history significant of HTN, BPH, urolithiasis. Presenting with 2 days of episodic right flank pain radiating to RLQ abdomen. It started as an ache but progressed to stabbing It comes in waves and lasts for 20 - 30 minutes at a time. He reports he's had an episode of N/V. He has not tried any medications to help his condition. The pain became unbearable last night, so he came to the ED. He denies any alleviating or aggravating factors.   ED Course: He was found to be in AKI. CT ab/pelvis revealed a 51mm obstructing renal stone in the right UVJ, right-sided hydronephrosis and hydroureter. Urology was consulted. [facility] was called for admission.   Review of Systems:  Denies CP, palpitations, dyspnea, F. Review of systems is otherwise negative for all not mentioned in HPI.   PMHx Past Medical History:  Diagnosis Date  . Diverticulitis   . Renal disorder    kidney stones    PSHx Past Surgical History:  Procedure Laterality Date  . COLON RESECTION      SocHx  reports that he has never smoked. He does not have any smokeless tobacco history on file. He reports that he does not drink alcohol and does not use drugs.  No Known Allergies  FamHx History reviewed. No pertinent family history.  Prior to Admission medications   Medication Sig Start Date End Date Taking? Authorizing Provider  albuterol (VENTOLIN HFA) 108 (90 Base) MCG/ACT inhaler Inhale 1-2 puffs into the lungs every 6 (six) hours as needed for wheezing or shortness of breath.   Yes [provider]  amLODipine (NORVASC) 5 MG tablet Take by mouth.   Yes [provider]  calcitRIOL (ROCALTROL) 0.25 MCG capsule Take 0.25 mcg by mouth daily. Mon, Wed, Friday   Yes [provider]  CALCIUM  ASCORBATE PO Take 1 tablet by mouth. Every Monday, Wednesday, Friday   Yes [provider]  tamsulosin (FLOMAX) 0.4 MG CAPS capsule Take by mouth.   Yes [provider]  torsemide (DEMADEX) 20 MG tablet Take 20 mg by mouth daily.    [provider]    Physical Exam: Vitals:   12/30/19 0130 12/30/19 0200 12/30/19 0300 12/30/19 0323  BP: 128/82 (!) 141/82 132/81 132/81  Pulse: 74 67 63 66  Resp:    18  Temp:    98.2 F (36.8 C)  TempSrc:    Oral  SpO2: 95% 93% 93% 95%  Weight:      Height:        General: 79 y.o. male resting in bed in NAD Eyes: normal sclera ENMT: Nares patent w/o discharge, orophaynx clear, dentition normal, ears w/o discharge/lesions/ulcers Neck: Supple, trachea midline Cardiovascular: RRR, +S1, S2, no m/g/r, equal pulses throughout Respiratory: CTABL, no w/r/r, normal WOB GI: BS+, NDNT, no masses noted, no organomegaly noted MSK: No e/c/c Skin: No rashes, bruises, ulcerations noted Neuro: A&O x 3, no focal deficits, hard of hearing Psyc: Appropriate interaction and affect, calm/cooperative  Labs on Admission: I have personally reviewed following labs and imaging studies  CBC: Recent Labs  Lab 12/29/19 2101  WBC 15.4*  HGB 11.1*  HCT 34.6*  MCV 83.0  PLT 811*   Basic Metabolic Panel: Recent Labs  Lab 12/29/19 2101  NA 135  K  3.7  CL 99  CO2 22  GLUCOSE 119*  BUN 45*  CREATININE 4.73*  CALCIUM 9.4   GFR: Estimated Creatinine Clearance: 13.9 mL/min (A) (by C-G formula based on SCr of 4.73 mg/dL (H)). Liver Function Tests: Recent Labs  Lab 12/29/19 2101  AST 16  ALT 12  ALKPHOS 90  BILITOT 0.5  PROT 7.8  ALBUMIN 3.7   Recent Labs  Lab 12/29/19 2101  LIPASE 15   No results for input(s): AMMONIA in the last 168 hours. Coagulation Profile: No results for input(s): INR, PROTIME in the last 168 hours. Cardiac Enzymes: No results for input(s): CKTOTAL, CKMB, CKMBINDEX, TROPONINI in the last 168  hours. BNP (last 3 results) No results for input(s): PROBNP in the last 8760 hours. HbA1C: No results for input(s): HGBA1C in the last 72 hours. CBG: No results for input(s): GLUCAP in the last 168 hours. Lipid Profile: No results for input(s): CHOL, HDL, LDLCALC, TRIG, CHOLHDL, LDLDIRECT in the last 72 hours. Thyroid Function Tests: No results for input(s): TSH, T4TOTAL, FREET4, T3FREE, THYROIDAB in the last 72 hours. Anemia Panel: No results for input(s): VITAMINB12, FOLATE, FERRITIN, TIBC, IRON, RETICCTPCT in the last 72 hours. Urine analysis:    Component Value Date/Time   COLORURINE YELLOW 12/29/2019 2101   APPEARANCEUR HAZY (A) 12/29/2019 2101   LABSPEC 1.020 12/29/2019 2101   PHURINE 6.0 12/29/2019 2101   GLUCOSEU NEGATIVE 12/29/2019 2101   HGBUR SMALL (A) 12/29/2019 2101   [place] NEGATIVE 12/29/2019 2101   [place] NEGATIVE 12/29/2019 2101   PROTEINUR 100 (A) 12/29/2019 2101   NITRITE NEGATIVE 12/29/2019 2101   LEUKOCYTESUR NEGATIVE 12/29/2019 2101    Radiological Exams on Admission: CT ABDOMEN PELVIS WO CONTRAST  Result Date: 12/29/2019 CLINICAL DATA:  Right-sided abdominal pain. EXAM: CT ABDOMEN AND PELVIS WITHOUT CONTRAST TECHNIQUE: Multidetector CT imaging of the abdomen and pelvis was performed following the standard protocol without IV contrast. COMPARISON:  August 24, 2014 FINDINGS: Lower chest: No acute abnormality. Hepatobiliary: No focal liver abnormality is seen. No gallstones, gallbladder wall thickening, or biliary dilatation. Pancreas: Unremarkable. No pancreatic ductal dilatation or surrounding inflammatory changes. Spleen: Normal in size without focal abnormality. Adrenals/Urinary Tract: Adrenal glands are unremarkable. Kidneys are normal in size. A 1.7 cm x 1.2 cm cyst is seen along the anterolateral aspect of the mid right kidney. An additional 1.1 cm x 0.8 cm cyst is seen along the anterior aspect of the mid left kidney. A 3 mm obstructing renal stone  is seen at the right UVJ. Moderate severity right-sided hydronephrosis and hydroureter are noted. Bladder is unremarkable. Stomach/Bowel: There is a small hiatal hernia. The appendix is not clearly identified. No evidence of bowel wall thickening, distention, or inflammatory changes. Noninflamed diverticula are seen throughout the large bowel. Vascular/Lymphatic: Aortic atherosclerosis. No enlarged abdominal or pelvic lymph nodes. 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 Other: No abdominal wall hernia or abnormality. No abdominopelvic ascites. Musculoskeletal: There is grade 1 anterolisthesis of the L5 vertebral body on S1. IMPRESSION: 1. 3 mm obstructing renal stone at the right UVJ. 2. Small hiatal hernia. 3. Colonic diverticulosis. 4. 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 5. 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 6. Aortic atherosclerosis. Aortic Atherosclerosis (ICD10-I70.0). Electronically Signed   By: Virgina Norfolk M.D.   On: 12/29/2019 23:34   Assessment/Plan Obstructing nephrolithiasis Right hydronephrosis/hydroureter AKI     - admit to inpt, med-surg     - urology to take or  OR     - fluids, follow UOP; baseline Scr is unknown; if AKI not improved after procedure, can consult nephrology; no acidic right now and no lyte disturbance     - NPO until OR   BPH     - continue flomax  HTN     - resume home meds  DVT prophylaxis: SCD  Code Status: FULL  Family Communication: None at bedside.  Consults called: EDP spoke with Urology   Status is: Inpatient  Remains inpatient appropriate because:Inpatient level of care appropriate due to severity of illness   Dispo: The patient is from: Home              Anticipated d/c is to: Home              Anticipated d/c date is: 3 days              Patient currently is not medically stable to d/c.  Jonnie Finner DO [facility]  If 7PM-7AM, please contact  night-coverage [email or web address]  12/30/2019, 8:03 AM

## 2019-12-30 NOTE — ED Provider Notes (Signed)
Blood pressure (!) 152/85, pulse 77, temperature 97.6 F (36.4 C), temperature source Oral, resp. rate 18, height 5\' 11"  (1.803 m), weight 86.2 kg, SpO2 98 %.  Assuming care from Dr. Tyrone Nine.  In short, Leslie Reese is a 79 y.o. male with a chief complaint of Abdominal Pain .  Refer to the original H&P for additional details.  The current plan of care is to f/u on CT results.  11:55 PM  Patient CT shows 3 mm obstructing stone on the right which correlates with patient's location of symptoms.  He has some acute kidney injury with baseline creatinine just below 2 after patient discussion and review of records.  Discussed the case with Dr. Abner Greenspan with urology.  They will see the patient at [facility] and plan for stenting in the morning.  Will discuss admission with the hospitalist.   Discussed patient's case with [facility] to request admission. Patient and family (if present) updated with plan. Care transferred to [facility] service.  I reviewed all nursing notes, vitals, pertinent old records, EKGs, labs, imaging (as available).  04:05 AM  Patient admitted by the hospitalist service but needs to be at [facility] Reese for urology evaluation and likely procedure first thing this morning.  A bed has not been assigned to the patient as of yet.  I do not want to delay urology evaluation and treatment and so I have discussed the case with Dr. Dayna Barker who accepts the patient to the [facility] emergency department.  Will contact [facility] for transport. COVID negative.       Margette Fast, MD 12/30/19 [phone]

## 2019-12-30 NOTE — Transfer of Care (Signed)
Immediate Anesthesia Transfer of Care Note  Patient: Leslie Reese  Procedure(s) Performed: CYSTOSCOPY WITH RETROGRADE PYELOGRAM, RIGHT URETERAL STENT PLACEMENT (Right )  Patient Location: PACU  Anesthesia Type:General  Level of Consciousness: awake  Airway & Oxygen Therapy: Patient Spontanous Breathing and Patient connected to face mask oxygen  Post-op Assessment: Report given to RN and Post -op Vital signs reviewed and stable  Post vital signs: Reviewed and stable  Last Vitals:  Vitals Value Taken Time  BP    Temp    Pulse 83 12/30/19 1325  Resp 20 12/30/19 1325  SpO2 100 % 12/30/19 1325  Vitals shown include unvalidated device data.  Last Pain:  Vitals:   12/30/19 1237  TempSrc:   PainSc: 2          Complications: No complications documented.

## 2019-12-30 NOTE — Anesthesia Procedure Notes (Signed)
Procedure Name: LMA Insertion Date/Time: 12/30/2019 1:01 PM Performed by: Sharlette Dense, CRNA Patient Re-evaluated:Patient Re-evaluated prior to induction Oxygen Delivery Method: Circle system utilized Preoxygenation: Pre-oxygenation with 100% oxygen Induction Type: IV induction Ventilation: Mask ventilation without difficulty LMA: LMA inserted LMA Size: 4.0 Number of attempts: 1 Placement Confirmation: positive ETCO2 and breath sounds checked- equal and bilateral Dental Injury: Teeth and Oropharynx as per pre-operative assessment

## 2019-12-30 NOTE — ED Notes (Signed)
Pt changing into gown, all clothing removed and placed in pt belonging bag, jewelery removed., teeth brushed.

## 2019-12-30 NOTE — ED Notes (Signed)
Pt resting in stretcher, offers no complaints at this time. States pain is minimal,denies need for pain medication at this time, will call RN if pain increases.

## 2019-12-30 NOTE — ED Notes (Signed)
[facility] on site for transport

## 2019-12-30 NOTE — Op Note (Signed)
Operative Note  Preoperative diagnosis:  1.  Right ureteral stone  Postoperative diagnosis: 1.  Right ureteral stone  Procedure(s): 1.  Cystoscopy 2. Right retrograde pyelogram with interpretation 3. Right ureteral stent placement  Surgeon: Rexene Alberts, MD  Assistants:  None  Anesthesia:  General  Complications:  None  EBL:  Minimal  Specimens: 1. None  Drains/Catheters: 1.  Right 6Fr x 26cm ureteral stent  Intraoperative findings:   1. Cystoscopy demonstrated no suspicious lesions, masses, stones or other pathology. 2. Right retrograde pyelogram demonstrated severe right hydronephrosis. 3. Successful right ureteral stent placement with curl in the right upper pole and bladder respectively.  Indication:  Leslie Reese is a 79 y.o. male with right ureteral stone with AKI.  After reviewing the management options for treatment, he elected to proceed with the above surgical procedure(s). We have discussed the potential benefits and risks of the procedure, side effects of the proposed treatment, the likelihood of the patient achieving the goals of the procedure, and any potential problems that might occur during the procedure or recuperation. Informed consent has been obtained.  Description of procedure: The patient was taken to the operating room and general anesthesia was induced.  The patient was placed in the dorsal lithotomy position, prepped and draped in the usual sterile fashion, and preoperative antibiotics were administered. A preoperative time-out was performed.   Cystourethroscopy was performed.  The patient's urethra was examined and was normal. 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 There was some bilobar prostatic hypertrophy. The bladder was then systematically examined in its entirety. There was no evidence for any bladder tumors, stones, or other mucosal pathology.    Attention then turned to the right ureteral orifice. No stone was identified. A 0.038 zip wire  was passed through the right orifice and over the wire a 5 Fr open ended catheter was inserted and passed up to the level of the renal pelvis. Omnipaque contrast was injected through the ureteral catheter and a retrograde pyelogram was performed with findings as dictated above with severe right hydronephrosis extending to the level of the distal right ureter. The wire was then replaced and the open ended catheter was removed.   A 6Fr x 26cm ureteral stent was advance over the wire. The stent was positioned appropriately under fluoroscopic and cystoscopic guidance.  The wire was then removed with an adequate stent curl noted in the right upper pole as well as in the bladder.  The bladder was then emptied and the procedure ended.  The patient appeared to tolerate the procedure well and without complications.  The patient was able to be awakened and transferred to the recovery unit in satisfactory condition.   Plan:  He will need to follow-up with his urologist in [facility] for definitive treatment of his stone.  [facility]  Pager: [phone]

## 2019-12-30 NOTE — Anesthesia Postprocedure Evaluation (Signed)
Anesthesia Post Note  Patient: Leslie Reese  Procedure(s) Performed: CYSTOSCOPY WITH RETROGRADE PYELOGRAM, RIGHT URETERAL STENT PLACEMENT (Right )     Patient location during evaluation: PACU Anesthesia Type: General Level of consciousness: awake and alert Pain management: pain level controlled Vital Signs Assessment: post-procedure vital signs reviewed and stable Respiratory status: spontaneous breathing, nonlabored ventilation, respiratory function stable and patient connected to nasal cannula oxygen Cardiovascular status: blood pressure returned to baseline and stable Postop Assessment: no apparent nausea or vomiting Anesthetic complications: no   No complications documented.  Last Vitals:  Vitals:   12/30/19 1324 12/30/19 1330  BP: 123/67 127/75  Pulse: 83 80  Resp: 20 16  Temp: 36.5 C   SpO2: 100% 100%    Last Pain:  Vitals:   12/30/19 1324  TempSrc:   PainSc: 0-No pain                 Meiah Zamudio S

## 2019-12-31 ENCOUNTER — Encounter (INDEPENDENT_AMBULATORY_CARE_PROVIDER_SITE_OTHER): Payer: Medicare Other | Admitting: Ophthalmology

## 2019-12-31 ENCOUNTER — Encounter (HOSPITAL_COMMUNITY): Payer: Self-pay | Admitting: Urology

## 2019-12-31 DIAGNOSIS — N200 Calculus of kidney: Secondary | ICD-10-CM | POA: Diagnosis not present

## 2019-12-31 DIAGNOSIS — N179 Acute kidney failure, unspecified: Secondary | ICD-10-CM | POA: Diagnosis not present

## 2019-12-31 LAB — CBC
HCT: 31.3 % — ABNORMAL LOW (ref 39.0–52.0)
Hemoglobin: 9.5 g/dL — ABNORMAL LOW (ref 13.0–17.0)
MCH: 26.3 pg (ref 26.0–34.0)
MCHC: 30.4 g/dL (ref 30.0–36.0)
MCV: 86.7 fL (ref 80.0–100.0)
Platelets: 311 10*3/uL (ref 150–400)
RBC: 3.61 MIL/uL — ABNORMAL LOW (ref 4.22–5.81)
RDW: 14.1 % (ref 11.5–15.5)
WBC: 8.9 10*3/uL (ref 4.0–10.5)
nRBC: 0 % (ref 0.0–0.2)

## 2019-12-31 LAB — COMPREHENSIVE METABOLIC PANEL
ALT: 10 U/L (ref 0–44)
AST: 10 U/L — ABNORMAL LOW (ref 15–41)
Albumin: 3.2 g/dL — ABNORMAL LOW (ref 3.5–5.0)
Alkaline Phosphatase: 69 U/L (ref 38–126)
Anion gap: 10 (ref 5–15)
BUN: 52 mg/dL — ABNORMAL HIGH (ref 8–23)
CO2: 21 mmol/L — ABNORMAL LOW (ref 22–32)
Calcium: 8.7 mg/dL — ABNORMAL LOW (ref 8.9–10.3)
Chloride: 109 mmol/L (ref 98–111)
Creatinine, Ser: 4.83 mg/dL — ABNORMAL HIGH (ref 0.61–1.24)
GFR, Estimated: 12 mL/min — ABNORMAL LOW (ref >60)
Glucose, Bld: 141 mg/dL — ABNORMAL HIGH (ref 70–99)
Potassium: 5.1 mmol/L (ref 3.5–5.1)
Sodium: 140 mmol/L (ref 135–145)
Total Bilirubin: 0.3 mg/dL (ref 0.3–1.2)
Total Protein: 6.7 g/dL (ref 6.5–8.1)

## 2019-12-31 LAB — SODIUM, URINE, RANDOM: Sodium, Ur: 94 mmol/L

## 2019-12-31 LAB — PROTEIN / CREATININE RATIO, URINE
Creatinine, Urine: 35.97 mg/dL
Protein Creatinine Ratio: 0.83 mg/mg{Cre} — ABNORMAL HIGH (ref 0.00–0.15)
Total Protein, Urine: 30 mg/dL

## 2019-12-31 LAB — CREATININE, URINE, RANDOM: Creatinine, Urine: 36.14 mg/dL

## 2019-12-31 NOTE — Plan of Care (Signed)
Patient will call for assistance when needing to get out of bed. Call bell and personal belongings are within reach.

## 2019-12-31 NOTE — Progress Notes (Signed)
The 24 hour urine sample was initiated at 1730.

## 2019-12-31 NOTE — Consult Note (Signed)
Renal Service Consult Note [facility]  Leslie Reese 12/31/2019 Sol Blazing, MD Requesting Physician: Dr Karleen Hampshire  Reason for Consult: Renal failure HPI: The patient is a 79 y.o. year-old w hx of renal disorder, diverticulitis, partial colon resection presented w/ R abd / flank pain on 12/26. Hx of kidney stones. +N/V. +hx prior urolithiasis.  Taken to OR yest for cysto, R ureteral stent placement. Images showed severe R hydro and R ureteral stent was placed in the renal pelvis and bladder w/o complication.  Cysto showed no suspicious lesions, masses , stones or other findings.   Pt seen in room, no c/o's today, no N/V, no abd pain, no jerking or confusion.   Pt ran 20 mi/ week and worked out in his 80's but had to stop running because of hip and knee issues, continued lifting wt's until last 15-20 yrs. Now is "losing muscle".     ROS  denies CP  no joint pain   no HA  no blurry vision  no rash  no diarrhea  no nausea/ vomiting  no dysuria  no difficulty voiding  no change in urine color    Past Medical History  Past Medical History:  Diagnosis Date  . Diverticulitis   . Renal disorder    kidney stones   Past Surgical History  Past Surgical History:  Procedure Laterality Date  . COLON RESECTION    . CYSTOSCOPY WITH RETROGRADE PYELOGRAM, URETEROSCOPY AND STENT PLACEMENT Right 12/30/2019   Procedure: CYSTOSCOPY WITH RETROGRADE PYELOGRAM, RIGHT URETERAL STENT PLACEMENT;  Surgeon: Janith Lima, MD;  Location: [facility] ORS;  Service: Urology;  Laterality: Right;   Family History History reviewed. No pertinent family history. Social History  reports that he has never smoked. He has never used smokeless tobacco. He reports that he does not drink alcohol and does not use drugs. Allergies No Known Allergies Home medications Prior to Admission medications   Medication Sig Start Date End Date Taking? Authorizing Provider  albuterol (VENTOLIN HFA) 108 (90 Base)  MCG/ACT inhaler Inhale 1-2 puffs into the lungs every 6 (six) hours as needed for wheezing or shortness of breath.   Yes [provider]  amLODipine (NORVASC) 5 MG tablet Take by mouth.   Yes [provider]  calcitRIOL (ROCALTROL) 0.25 MCG capsule Take 0.25 mcg by mouth daily. Mon, Wed, Friday   Yes [provider]  CALCIUM ASCORBATE PO Take 1 tablet by mouth. Every Monday, Wednesday, Friday   Yes [provider]  tamsulosin (FLOMAX) 0.4 MG CAPS capsule Take by mouth.   Yes [provider]  torsemide (DEMADEX) 20 MG tablet Take 20 mg by mouth daily.    [provider]     Vitals:   12/31/19 5784 12/31/19 0655 12/31/19 1044 12/31/19 1309  BP: 127/66  123/78 (!) 154/84  Pulse: 80  80 81  Resp: 14   (!) 24  Temp: 97.6 F (36.4 C)   (!) 97.5 F (36.4 C)  TempSrc: Oral   Oral  SpO2: 93%  99% 99%  Weight:  86.4 kg    Height:       Exam Gen alert, no distress, WD and WN No rash, cyanosis or gangrene Sclera anicteric, throat clear  No jvd or bruits Chest clear bilat to bases RRR no MRG Abd soft ntnd no mass or ascites +bs GU normal male, no foley  MS no joint effusions or deformity Ext no LE or UE edema, no wounds or ulcers  Neuro  is alert, Ox 3 , nf    Home meds:  - norvasc 5 / demadex 20 qd  - flomax 0.4/ rocaltrol 0.25 mwf  - prn's/ vitamins/ supplements        Date  Creat  eGFR     Aug 2016 1.83  40     12/29/19 4.73  12      12/30/19 4.83  12     CT abd wo contrast 12/26 - Kidneys are normal in size. A 3 mm obstructing renal stone is seen at the right UVJ. Moderate severity right-sided hydronephrosis and hydroureter are noted. Bladder is unremarkable.      BP's in ED 130 88, HR 84.  No low BP's since admission.  On room air or 2L [place] max here.  I/O 3.0 L in and 2.1 UOP.        Na 140  K 5.1  CO2 21   BUN 52  Cr 4.83  Glu 141  Alb 3.2  LFT"s okay    wBC 8K  Hb 9.5  mcb 86  plt 311       UA 12/26 - prot 100, 0-5 wbc/  rbc /epi, few bact, +gran casts   Assessment/ Plan: 1. 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 Pt if f/b renal MD in [facility]. Here creat is up to 4.7- 4.8 in the setting of R UVJ stone w/ obstruction of R kidney.  Pt is now sp internal R JJ stent placement. Kidneys on CT are somewhat scarred and shrunken c/w CKD.  Suspect renal function to recover back to baseline creat 2- 3 w/ relief of obstruction, IVF's and time.  Agree w/ IVF"s. Will follow.  2. R UVJ stone - sp R JJ stenting by urology 3. HTN/ volume  - cont norvasc, hold demadex, no vol overload on exam, giving IVF"s, would continue.       Kelly Splinter  MD 12/31/2019, 4:26 PM  Recent Labs  Lab 12/29/19 2101 12/31/19 0514  WBC 15.4* 8.9  HGB 11.1* 9.5*   Recent Labs  Lab 12/29/19 2101 12/31/19 0514  K 3.7 5.1  BUN 45* 52*  CREATININE 4.73* 4.83*  CALCIUM 9.4 8.7*

## 2019-12-31 NOTE — Progress Notes (Signed)
PROGRESS NOTE    Leslie Reese  XBJ:[id number] DOB: Jul 21, 1940 DOA: 12/29/2019 PCP: Egbert Garibaldi, PA-C    Chief Complaint  Patient presents with  . Abdominal Pain    Brief Narrative:  79 year old gentleman with prior history of hypertension, BPH, renal stones presents with 2 days of right flank pain radiating to the groin.  On arrival to the ED he was found to be in AKI and CT of the abdomen and pelvis revealed a 3 mm obstructing renal stone in the right UVJ associated with severe right-sided hydronephrosis and hydroureter.  Urology was consulted and he underwent cystoscopy and stent placement on the right ureter on 12/30/2019. Patient seen and examined at bedside reports his flank pain has improved.  Assessment & Plan:   Active Problems:   AKI (acute kidney injury) ([place])   Right obstructing ureteral stone with right-sided hydronephrosis Urine analysis without any signs of infection. Urology consulted and he underwent cystoscopy and right ureteral stent yesterday. Patient remains afebrile but with leukocytosis.   AKI Creatinine on admission 4.73 slightly worsened to 4.8 today. Patient reports his baseline creatinine around 2, no records in epic. Nephrology consulted for further evaluation.  Urine creatinine, urine analysis and urine protein ordered for further evaluation.  Essential hypertension Blood pressure parameters appear to be optimal   BPH Continue with Flomax   DVT prophylaxis:scd's Code Status: FULL CODE.  Family Communication: family at bedside.  Disposition:   Status is: Inpatient  Remains inpatient appropriate because:Ongoing diagnostic testing needed not appropriate for outpatient work up, Unsafe d/c plan and Inpatient level of care appropriate due to severity of illness   Dispo: The patient is from: Home              Anticipated d/c is to: Home              Anticipated d/c date is: 2 days              Patient currently is not medically stable  to d/c.       Consultants:   Urology  Nephrology.   Procedures:Cystoscopy.   Antimicrobials: none.   Subjective: Flank pain is improving.   Objective: Vitals:   12/31/19 0329 12/31/19 0655 12/31/19 1044 12/31/19 1309  BP: 127/66  123/78 (!) 154/84  Pulse: 80  80 81  Resp: 14   (!) 24  Temp: 97.6 F (36.4 C)   (!) 97.5 F (36.4 C)  TempSrc: Oral   Oral  SpO2: 93%  99% 99%  Weight:  86.4 kg    Height:        Intake/Output Summary (Last 24 hours) at 12/31/2019 1520 Last data filed at 12/31/2019 1300 Gross per 24 hour  Intake --  Output 1725 ml  Net -1725 ml   Filed Weights   12/29/19 2054 12/30/19 1237 12/31/19 0655  Weight: 86.2 kg 86.2 kg 86.4 kg    Examination:  General exam: Appears calm and comfortable  Respiratory system: Clear to auscultation. Respiratory effort normal. Cardiovascular system: S1 & S2 heard, RRR. No JVD,  No pedal edema. Gastrointestinal system: Abdomen is nondistended, soft and nontender. Normal bowel sounds heard. Central nervous system: Alert and oriented. No focal neurological deficits. Extremities: Symmetric 5 x 5 power. Skin: No rashes, lesions or ulcers Psychiatry:  Mood & affect appropriate.     Data Reviewed: I have personally reviewed following labs and imaging studies  CBC: Recent Labs  Lab 12/29/19 2101 12/31/19 0514  WBC 15.4* 8.9  HGB 11.1*  9.5*  HCT 34.6* 31.3*  MCV 83.0 86.7  PLT 407* 607    Basic Metabolic Panel: Recent Labs  Lab 12/29/19 2101 12/31/19 0514  NA 135 140  K 3.7 5.1  CL 99 109  CO2 22 21*  GLUCOSE 119* 141*  BUN 45* 52*  CREATININE 4.73* 4.83*  CALCIUM 9.4 8.7*    GFR: Estimated Creatinine Clearance: 13.6 mL/min (A) (by C-G formula based on SCr of 4.83 mg/dL (H)).  Liver Function Tests: Recent Labs  Lab 12/29/19 2101 12/31/19 0514  AST 16 10*  ALT 12 10  ALKPHOS 90 69  BILITOT 0.5 0.3  PROT 7.8 6.7  ALBUMIN 3.7 3.2*    CBG: No results for input(s): GLUCAP in the  last 168 hours.   Recent Results (from the past 240 hour(s))  Resp Panel by RT-PCR (Flu A&B, Covid) Nasopharyngeal Swab     Status: None   Collection Time: 12/29/19 11:14 PM   Specimen: Nasopharyngeal Swab; Nasopharyngeal(NP) swabs in vial transport medium  Result Value Ref Range Status   SARS Coronavirus 2 by RT PCR NEGATIVE NEGATIVE Final    Comment: (NOTE) SARS-CoV-2 target nucleic acids are NOT DETECTED.  The SARS-CoV-2 RNA is generally detectable in upper respiratory specimens during the acute phase of infection. The lowest concentration of SARS-CoV-2 viral copies this assay can detect is 138 copies/mL. A negative result does not preclude SARS-Cov-2 infection and should not be used as the sole basis for treatment or other patient management decisions. A negative result may occur with  improper specimen collection/handling, submission of specimen other than nasopharyngeal swab, presence of viral mutation(s) within the areas targeted by this assay, and inadequate number of viral copies(<138 copies/mL). A negative result must be combined with clinical observations, patient history, and epidemiological information. The expected result is Negative.  Fact Sheet for Patients:  EntrepreneurPulse.com.au  Fact Sheet for Healthcare Providers:  IncredibleEmployment.be  This test is no t yet approved or cleared by the [place] FDA and  has been authorized for detection and/or diagnosis of SARS-CoV-2 by FDA under an Emergency Use Authorization (EUA). This EUA will remain  in effect (meaning this test can be used) for the duration of the COVID-19 declaration under Section 564(b)(1) of the Act, 21 U.S.C.section 360bbb-3(b)(1), unless the authorization is terminated  or revoked sooner.       Influenza A by PCR NEGATIVE NEGATIVE Final   Influenza B by PCR NEGATIVE NEGATIVE Final    Comment: (NOTE) The Xpert Xpress SARS-CoV-2/FLU/RSV plus assay is  intended as an aid in the diagnosis of influenza from Nasopharyngeal swab specimens and should not be used as a sole basis for treatment. Nasal washings and aspirates are unacceptable for Xpert Xpress SARS-CoV-2/FLU/RSV testing.  Fact Sheet for Patients: EntrepreneurPulse.com.au  Fact Sheet for Healthcare Providers: IncredibleEmployment.be  This test is not yet approved or cleared by the [place] FDA and has been authorized for detection and/or diagnosis of SARS-CoV-2 by FDA under an Emergency Use Authorization (EUA). This EUA will remain in effect (meaning this test can be used) for the duration of the COVID-19 declaration under Section 564(b)(1) of the Act, 21 U.S.C. section 360bbb-3(b)(1), unless the authorization is terminated or revoked.  Performed at [facility], [address] [place], [place] [address]          Radiology Studies: CT ABDOMEN PELVIS WO CONTRAST  Result Date: 12/29/2019 CLINICAL DATA:  Right-sided abdominal pain. EXAM: CT ABDOMEN AND PELVIS WITHOUT CONTRAST TECHNIQUE: Multidetector CT  imaging of the abdomen and pelvis was performed following the standard protocol without IV contrast. COMPARISON:  August 24, 2014 FINDINGS: Lower chest: No acute abnormality. Hepatobiliary: No focal liver abnormality is seen. No gallstones, gallbladder wall thickening, or biliary dilatation. Pancreas: Unremarkable. No pancreatic ductal dilatation or surrounding inflammatory changes. Spleen: Normal in size without focal abnormality. Adrenals/Urinary Tract: Adrenal glands are unremarkable. Kidneys are normal in size. A 1.7 cm x 1.2 cm cyst is seen along the anterolateral aspect of the mid right kidney. An additional 1.1 cm x 0.8 cm cyst is seen along the anterior aspect of the mid left kidney. A 3 mm obstructing renal stone is seen at the right UVJ. Moderate severity right-sided hydronephrosis and hydroureter are noted. Bladder is  unremarkable. Stomach/Bowel: There is a small hiatal hernia. The appendix is not clearly identified. No evidence of bowel wall thickening, distention, or inflammatory changes. Noninflamed diverticula are seen throughout the large bowel. Vascular/Lymphatic: Aortic atherosclerosis. No enlarged abdominal or pelvic lymph nodes. 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 Other: No abdominal wall hernia or abnormality. No abdominopelvic ascites. Musculoskeletal: There is grade 1 anterolisthesis of the L5 vertebral body on S1. IMPRESSION: 1. 3 mm obstructing renal stone at the right UVJ. 2. Small hiatal hernia. 3. Colonic diverticulosis. 4. 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 5. 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 6. Aortic atherosclerosis. Aortic Atherosclerosis (ICD10-I70.0). Electronically Signed   By: Virgina Norfolk M.D.   On: 12/29/2019 23:34   DG C-Arm 1-60 Min-No Report  Result Date: 12/30/2019 Fluoroscopy was utilized by the requesting physician.  No radiographic interpretation.        Scheduled Meds: . amLODipine  5 mg Oral Daily  . tamsulosin  0.4 mg Oral Daily   Continuous Infusions: . sodium chloride 100 mL/hr at 12/31/19 1445     LOS: 1 day        Hosie Poisson, MD [facility]   To contact the attending provider between 7A-7P or the covering provider during after hours 7P-7A, please log into the web site [email or web address] and access using universal [place] password for that web site. If you do not have the password, please call the hospital operator.  12/31/2019, 3:20 PM

## 2019-12-31 NOTE — Progress Notes (Signed)
Patient ambulated 180 SF three times. Activity tolerated well.

## 2019-12-31 NOTE — Progress Notes (Signed)
1 Day Post-Op Subjective: Pain controlled. No nausea or emesis.   Objective: Vital signs in last 24 hours: Temp:  [97.5 F (36.4 C)-98 F (36.7 C)] 97.6 F (36.4 C) (12/28 0329) Pulse Rate:  [69-95] 80 (12/28 0329) Resp:  [14-23] 14 (12/28 0329) BP: (122-156)/(66-94) 127/66 (12/28 0329) SpO2:  [93 %-100 %] 93 % (12/28 0329) Weight:  [86.2 kg-86.4 kg] 86.4 kg (12/28 0655)  Intake/Output from previous day: 12/27 0701 - 12/28 0700 In: 874 [P.O.:474; I.V.:300; IV Piggyback:100] Out: 1150 [Urine:1150] Intake/Output this shift: No intake/output data recorded.  Physical Exam:  General: Alert and oriented CV: RRR Lungs: Clear Abdomen: Soft, ND, NT Ext: NT, No erythema  Lab Results: Recent Labs    12/29/19 2101 12/31/19 0514  HGB 11.1* 9.5*  HCT 34.6* 31.3*   BMET Recent Labs    12/29/19 2101 12/31/19 0514  NA 135 140  K 3.7 5.1  CL 99 109  CO2 22 21*  GLUCOSE 119* 141*  BUN 45* 52*  CREATININE 4.73* 4.83*  CALCIUM 9.4 8.7*     Studies/Results: CT ABDOMEN PELVIS WO CONTRAST  Result Date: 12/29/2019 CLINICAL DATA:  Right-sided abdominal pain. EXAM: CT ABDOMEN AND PELVIS WITHOUT CONTRAST TECHNIQUE: Multidetector CT imaging of the abdomen and pelvis was performed following the standard protocol without IV contrast. COMPARISON:  August 24, 2014 FINDINGS: Lower chest: No acute abnormality. Hepatobiliary: No focal liver abnormality is seen. No gallstones, gallbladder wall thickening, or biliary dilatation. Pancreas: Unremarkable. No pancreatic ductal dilatation or surrounding inflammatory changes. Spleen: Normal in size without focal abnormality. Adrenals/Urinary Tract: Adrenal glands are unremarkable. Kidneys are normal in size. A 1.7 cm x 1.2 cm cyst is seen along the anterolateral aspect of the mid right kidney. An additional 1.1 cm x 0.8 cm cyst is seen along the anterior aspect of the mid left kidney. A 3 mm obstructing renal stone is seen at the right UVJ. Moderate  severity right-sided hydronephrosis and hydroureter are noted. Bladder is unremarkable. Stomach/Bowel: There is a small hiatal hernia. The appendix is not clearly identified. No evidence of bowel wall thickening, distention, or inflammatory changes. Noninflamed diverticula are seen throughout the large bowel. Vascular/Lymphatic: Aortic atherosclerosis. No enlarged abdominal or pelvic lymph nodes. 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 Other: No abdominal wall hernia or abnormality. No abdominopelvic ascites. Musculoskeletal: There is grade 1 anterolisthesis of the L5 vertebral body on S1. IMPRESSION: 1. 3 mm obstructing renal stone at the right UVJ. 2. Small hiatal hernia. 3. Colonic diverticulosis. 4. 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 5. 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 6. Aortic atherosclerosis. Aortic Atherosclerosis (ICD10-I70.0). Electronically Signed   By: Virgina Norfolk M.D.   On: 12/29/2019 23:34   DG C-Arm 1-60 Min-No Report  Result Date: 12/30/2019 Fluoroscopy was utilized by the requesting physician.  No radiographic interpretation.    Assessment/Plan: 1. Right obstructing ureteral stone: CT A/P 12/29/2019 revealed a 3 mm obstructing renal stone at the right UVJ with severe right-sided hydronephrosis.  Afebrile, WBC 15.4, urinalysis without sign of infection, urine culture pending, creatinine 4.73 from an unknown baseline. S/p cysto, R ureteral stent 12/30/2019. 2. AKI: 4.73 from unknown baseline.  Persistently elevated despite right stent. 3. Right hydronephrosis: Due to obstructing right ureteral stone 4. History of urolithiasis: S/p right ESWL about a month ago in [facility] 5.  Bilateral simple appearing renal cysts  -Pt with persistent AKI, 4.83 from 4.73 despite right ureteral stent -Would continue  to trend and evaluate for other causes of AKI/CKD -Will need follow-up with Dr. Harlow Asa with  urology in [facility] for definitive treatment of his stone   LOS: 1 day   Matt R. Kayle Passarelli MD 12/31/2019, 7:31 AM [facility]  Pager: [phone]

## 2020-01-01 ENCOUNTER — Inpatient Hospital Stay (HOSPITAL_COMMUNITY): Payer: Medicare Other

## 2020-01-01 DIAGNOSIS — N400 Enlarged prostate without lower urinary tract symptoms: Secondary | ICD-10-CM | POA: Diagnosis present

## 2020-01-01 DIAGNOSIS — N184 Chronic kidney disease, stage 4 (severe): Secondary | ICD-10-CM

## 2020-01-01 DIAGNOSIS — N132 Hydronephrosis with renal and ureteral calculous obstruction: Secondary | ICD-10-CM | POA: Diagnosis not present

## 2020-01-01 DIAGNOSIS — I10 Essential (primary) hypertension: Secondary | ICD-10-CM

## 2020-01-01 DIAGNOSIS — N179 Acute kidney failure, unspecified: Secondary | ICD-10-CM | POA: Diagnosis not present

## 2020-01-01 LAB — BASIC METABOLIC PANEL
Anion gap: 10 (ref 5–15)
BUN: 61 mg/dL — ABNORMAL HIGH (ref 8–23)
CO2: 21 mmol/L — ABNORMAL LOW (ref 22–32)
Calcium: 8.2 mg/dL — ABNORMAL LOW (ref 8.9–10.3)
Chloride: 108 mmol/L (ref 98–111)
Creatinine, Ser: 4.62 mg/dL — ABNORMAL HIGH (ref 0.61–1.24)
GFR, Estimated: 12 mL/min — ABNORMAL LOW (ref >60)
Glucose, Bld: 102 mg/dL — ABNORMAL HIGH (ref 70–99)
Potassium: 4.4 mmol/L (ref 3.5–5.1)
Sodium: 139 mmol/L (ref 135–145)

## 2020-01-01 LAB — CBC
HCT: 28.3 % — ABNORMAL LOW (ref 39.0–52.0)
Hemoglobin: 8.8 g/dL — ABNORMAL LOW (ref 13.0–17.0)
MCH: 26.5 pg (ref 26.0–34.0)
MCHC: 31.1 g/dL (ref 30.0–36.0)
MCV: 85.2 fL (ref 80.0–100.0)
Platelets: 307 10*3/uL (ref 150–400)
RBC: 3.32 MIL/uL — ABNORMAL LOW (ref 4.22–5.81)
RDW: 14.5 % (ref 11.5–15.5)
WBC: 11.4 10*3/uL — ABNORMAL HIGH (ref 4.0–10.5)
nRBC: 0 % (ref 0.0–0.2)

## 2020-01-01 MED ORDER — TORSEMIDE 20 MG PO TABS: 20.0000 mg | ORAL_TABLET | Freq: Every day | ORAL | Status: AC

## 2020-01-01 MED ORDER — POLYETHYLENE GLYCOL 3350 17 G PO PACK
17.0000 g | PACK | Freq: Every day | ORAL | Status: DC
  Administered 2020-01-01: 17 g via ORAL
  Filled 2020-01-01: qty 1

## 2020-01-01 NOTE — Progress Notes (Addendum)
[facility] Progress Note  Subjective: 2.5 L UOP yest, wt's stable, creat down 4.8 > 4.6 today. BP's low normal.   Vitals:   12/31/19 1309 12/31/19 2100 01/01/20 0423 01/01/20 0500  BP: (!) 154/84 129/84 118/83   Pulse: 81 70 65   Resp: (!) 24 16 16    Temp: (!) 97.5 F (36.4 C) 98.2 F (36.8 C) 98.5 F (36.9 C)   TempSrc: Oral     SpO2: 99% 97% 97%   Weight:    87.3 kg  Height:        Exam:  alert, nad   no jvd  Chest cta bilat  Cor reg no RG  Abd soft ntnd no ascites   Ext no LE edema   Alert, NF, ox3     Home meds:  - norvasc 5 / demadex 20 qd prn if > 190 lbs  - flomax 0.4/ rocaltrol 0.25 mwf  - prn's/ vitamins/ supplements        Date             Creat               eGFR     Aug 2016     1.83                 40     12/29/19      4.73                 12      12/30/19     4.83                 12     CT abd wo contrast 12/26 - Kidneys are normal in size. A 3 mm obstructing renal stone is seen at the right UVJ. Moderate severity right-sided hydronephrosis and hydroureter are noted. Bladder is unremarkable.      BP's in ED 130 88, HR 84.  No low BP's since admission.  On room air or 2L [place] max here.  I/O 3.0 L in and 2.1 UOP.        Na 140  K 5.1  CO2 21   BUN 52  Cr 4.83  Glu 141  Alb 3.2  LFT"s okay    wBC 8K  Hb 9.5  mcb 86  plt 311       UA 12/26 - prot 100, 0-5 wbc/ rbc /epi, few bact, +gran casts   Assessment/ Plan: 1. 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 Pt if f/b renal MD in [facility]. Admit creat 4.7 in the setting of R UVJ stone w/ obstruction of R kidney. Is now sp internal R JJ stent placement 12/27.  Kidneys on CT are somewhat scarred and shrunken c/w CKD.  Creat up 4.8 yest and > 4.6 today. Suspect renal function to recover w/ relief of obstruction, IVF's and time.  Renal US ordered by urology. If hydronephrosis resolved by [place] would consider dc home today. He has appt next week w/ his renal MD in [facility]. Will d/w  pmd.  2. R UVJ stone - sp R JJ stenting by urology 3. HTN/ volume  - cont norvasc, hold demadex, euvolemic on exam, cont IVF   Rob Lyvia Mondesir 01/01/2020, 7:00 AM   Addendum: renal US done this am should no hydronephrosis. This suggests the R JJ internal stent is working. Okay for dc from renal standpoint.   Kelly Splinter, MD 01/01/2020,  3:05 PM                    Recent Labs  Lab 12/31/19 0514 01/01/20 0457  K 5.1 4.4  BUN 52* 61*  CREATININE 4.83* 4.62*  CALCIUM 8.7* 8.2*  HGB 9.5* 8.8*   Inpatient medications: . amLODipine  5 mg Oral Daily  . tamsulosin  0.4 mg Oral Daily   . sodium chloride 100 mL/hr at 01/01/20 0050   acetaminophen **OR** acetaminophen, morphine injection, ondansetron (ZOFRAN) IV

## 2020-01-01 NOTE — Progress Notes (Addendum)
Pt. dicharged via wheelchair accompanied by wife and NT. Pt. Is alert and oriented, nno complaints or any distress. AVS instruction and education discussed with patient and wife, both verbalized understanding. Discharge papers given.  All personal belongings are with the family.

## 2020-01-01 NOTE — Progress Notes (Signed)
2 Days Post-Op Subjective: Pain controlled. No nausea or emesis.   Objective: Vital signs in last 24 hours: Temp:  [97.5 F (36.4 C)-98.5 F (36.9 C)] 98.5 F (36.9 C) (12/29 0423) Pulse Rate:  [65-81] 65 (12/29 0423) Resp:  [16-24] 16 (12/29 0423) BP: (118-154)/(78-84) 118/83 (12/29 0423) SpO2:  [97 %-99 %] 97 % (12/29 0423) Weight:  [87.3 kg] 87.3 kg (12/29 0500)  Intake/Output from previous day: 12/28 0701 - 12/29 0700 In: 3495 [P.O.:240; I.V.:3255] Out: 2575 [Urine:2575] Intake/Output this shift: Total I/O In: -  Out: 300 [Urine:300]  Physical Exam:  General: Alert and oriented CV: RRR Lungs: Clear Abdomen: Soft, ND, NT Ext: NT, No erythema  Lab Results: Recent Labs    12/29/19 2101 12/31/19 0514 01/01/20 0457  HGB 11.1* 9.5* 8.8*  HCT 34.6* 31.3* 28.3*   BMET Recent Labs    12/31/19 0514 01/01/20 0457  NA 140 139  K 5.1 4.4  CL 109 108  CO2 21* 21*  GLUCOSE 141* 102*  BUN 52* 61*  CREATININE 4.83* 4.62*  CALCIUM 8.7* 8.2*     Studies/Results: DG C-Arm 1-60 Min-No Report  Result Date: 12/30/2019 Fluoroscopy was utilized by the requesting physician.  No radiographic interpretation.    Assessment/Plan: 1. Rightobstructing ureteral stone:CT A/P 12/29/2019 revealed a 3 mm obstructing renal stone at the right UVJ with severe right-sided hydronephrosis.Afebrile,WBC15.4, urinalysis without sign of infection, urine culture pending, creatinine 4.73 from an unknown baseline. S/p cysto, R ureteral stent 12/30/2019. 2. AKI: 4.73 on initial evaluation from unknown baseline. Persistently elevated despite right stent. 3. Right hydronephrosis: Due to obstructing right ureteral stone 4. History of urolithiasis: S/p right ESWL about a month ago in [facility] 5.Bilateral simple appearing renal cysts  -Creatinine still elevated at 4.62 despite stent decompression -Ordered RBUS -AKI/CKD w/u per nephrology   LOS: 2 days   Matt R. Christia Domke MD 01/01/2020,  7:40 AM [facility]  Pager: [phone]

## 2020-01-01 NOTE — Progress Notes (Addendum)
PROGRESS NOTE    Leslie Reese  HER:[id number] DOB: 29-Jan-1940 DOA: 12/29/2019 PCP: Egbert Garibaldi, PA-C   Brief Narrative:  79 year old male with prior history of hypertension, BPH, renal stones presented to hospital with 2 days of right flank pain radiating to the groin.  In the ED, he was noted to have 3 mm obstructing stone in the right UVJ associated with severe right-sided hydronephrosis and hydroureter.  Urology was consulted and he underwent cystoscopy and stent placement on the right ureter on 12/30/2019.  Patient was then admitted hospital for evaluation and treatment.   Assessment & Plan:   Principal Problem:   AKI (acute kidney injury) ([place]) Active Problems:   Hydronephrosis with renal and ureteral calculus obstruction   Essential hypertension   BPH (benign prostatic hyperplasia)  Right obstructing ureteral stone with right-sided hydronephrosis Patient underwent cystoscopy and ureteral stent placement by urology on 12/30/2019.  No fever but with mild leukocytosis at 11.4.  UA without signs of infection.  Continue tamsulosin.  Continue monitor renal function.  History of urolithiasis status post ESWL a month back.  Seen by urology today.  AKI Creatinine on admission 4.73.  Baseline creatinine of around 2 as per the patient.  Nephrology on board and the impression is AKI on CKD stage IV.  We will continue to monitor creatinine levels.  Protein to creatinine ratio of 0.8.  Persistently elevated creatinine levels despite ureteral stent.  Will follow nephrology recommendations..  Hold Demadex.  Continue gentle IV fluid hydration.  Lab Results  Component Value Date   CREATININE 4.62 (H) 01/01/2020   CREATININE 4.83 (H) 12/31/2019   CREATININE 4.73 (H) 12/29/2019    Essential hypertension Currently on amlodipine.  Will closely monitor.   BPH Continue Flomax  Addendum:  01/01/2020 4:41 PM   Spoke with nephrology and urology. Okay for discharge home today. DVT  prophylaxis: Sequential compression device  Code Status: Full code  Family Communication:  Spoke with the spouse at bedside.  Disposition:  Status is: Inpatient Remains inpatient appropriate because:Ongoing diagnostic testing needed not appropriate for outpatient work up,  and Inpatient level of care appropriate due to severity of illness Dispo: The patient is from: Home               Anticipated d/c is to: Home               Anticipated d/c date is: 1-2 days           Patient currently is not medically stable to d/c.   Consultants:   Urology  Nephrology.   Procedures: Cystoscopy with ureteral stent placement on 12/30/2019.   Antimicrobials:  none.   Subjective: Patient was seen and examined at bedside.  Patient denies any urinary urgency frequency nausea vomiting fever or chills.  Objective: Vitals:   12/31/19 1309 12/31/19 2100 01/01/20 0423 01/01/20 0500  BP: (!) 154/84 129/84 118/83   Pulse: 81 70 65   Resp: (!) 24 16 16    Temp: (!) 97.5 F (36.4 C) 98.2 F (36.8 C) 98.5 F (36.9 C)   TempSrc: Oral     SpO2: 99% 97% 97%   Weight:    87.3 kg  Height:        Intake/Output Summary (Last 24 hours) at 01/01/2020 0825 Last data filed at 01/01/2020 0735 Gross per 24 hour  Intake 3494.97 ml  Output 2875 ml  Net 619.97 ml   Filed Weights   12/30/19 1237 12/31/19 0655 01/01/20 0500  Weight: 86.2 kg  86.4 kg 87.3 kg   Body mass index is 26.83 kg/m. Physical examination: General:  Average built, not in obvious distress HENT:   No scleral pallor or icterus noted. Oral mucosa is moist.  Chest:  Clear breath sounds.  Diminished breath sounds bilaterally. No crackles or wheezes.  CVS: S1 &S2 heard. No murmur.  Regular rate and rhythm. Abdomen: Soft, nontender, nondistended.  Bowel sounds are heard.   Extremities: No cyanosis, clubbing or edema.  Peripheral pulses are palpable. Psych: Alert, awake and oriented, normal mood CNS:  No cranial nerve deficits.   Power equal in all extremities.   Skin: Warm and dry.  No rashes noted.   Data Reviewed:  I personally reviewed the following labs and imaging studies  CBC: Recent Labs  Lab 12/29/19 2101 12/31/19 0514 01/01/20 0457  WBC 15.4* 8.9 11.4*  HGB 11.1* 9.5* 8.8*  HCT 34.6* 31.3* 28.3*  MCV 83.0 86.7 85.2  PLT 407* 311 366    Basic Metabolic Panel: Recent Labs  Lab 12/29/19 2101 12/31/19 0514 01/01/20 0457  NA 135 140 139  K 3.7 5.1 4.4  CL 99 109 108  CO2 22 21* 21*  GLUCOSE 119* 141* 102*  BUN 45* 52* 61*  CREATININE 4.73* 4.83* 4.62*  CALCIUM 9.4 8.7* 8.2*    GFR: Estimated Creatinine Clearance: 14.3 mL/min (A) (by C-G formula based on SCr of 4.62 mg/dL (H)).  Liver Function Tests: Recent Labs  Lab 12/29/19 2101 12/31/19 0514  AST 16 10*  ALT 12 10  ALKPHOS 90 69  BILITOT 0.5 0.3  PROT 7.8 6.7  ALBUMIN 3.7 3.2*    CBG: No results for input(s): GLUCAP in the last 168 hours.   Recent Results (from the past 240 hour(s))  Resp Panel by RT-PCR (Flu A&B, Covid) Nasopharyngeal Swab     Status: None   Collection Time: 12/29/19 11:14 PM   Specimen: Nasopharyngeal Swab; Nasopharyngeal(NP) swabs in vial transport medium  Result Value Ref Range Status   SARS Coronavirus 2 by RT PCR NEGATIVE NEGATIVE Final    Comment: (NOTE) SARS-CoV-2 target nucleic acids are NOT DETECTED.  The SARS-CoV-2 RNA is generally detectable in upper respiratory specimens during the acute phase of infection. The lowest concentration of SARS-CoV-2 viral copies this assay can detect is 138 copies/mL. A negative result does not preclude SARS-Cov-2 infection and should not be used as the sole basis for treatment or other patient management decisions. A negative result may occur with  improper specimen collection/handling, submission of specimen other than nasopharyngeal swab, presence of viral mutation(s) within the areas targeted by this assay, and inadequate number of viral copies(<138  copies/mL). A negative result must be combined with clinical observations, patient history, and epidemiological information. The expected result is Negative.  Fact Sheet for Patients:  EntrepreneurPulse.com.au  Fact Sheet for Healthcare Providers:  IncredibleEmployment.be  This test is no t yet approved or cleared by the [place] FDA and  has been authorized for detection and/or diagnosis of SARS-CoV-2 by FDA under an Emergency Use Authorization (EUA). This EUA will remain  in effect (meaning this test can be used) for the duration of the COVID-19 declaration under Section 564(b)(1) of the Act, 21 U.S.C.section 360bbb-3(b)(1), unless the authorization is terminated  or revoked sooner.       Influenza A by PCR NEGATIVE NEGATIVE Final   Influenza B by PCR NEGATIVE NEGATIVE Final    Comment: (NOTE) The Xpert Xpress SARS-CoV-2/FLU/RSV plus assay is intended as an aid in the  diagnosis of influenza from Nasopharyngeal swab specimens and should not be used as a sole basis for treatment. Nasal washings and aspirates are unacceptable for Xpert Xpress SARS-CoV-2/FLU/RSV testing.  Fact Sheet for Patients: EntrepreneurPulse.com.au  Fact Sheet for Healthcare Providers: IncredibleEmployment.be  This test is not yet approved or cleared by the [place] FDA and has been authorized for detection and/or diagnosis of SARS-CoV-2 by FDA under an Emergency Use Authorization (EUA). This EUA will remain in effect (meaning this test can be used) for the duration of the COVID-19 declaration under Section 564(b)(1) of the Act, 21 U.S.C. section 360bbb-3(b)(1), unless the authorization is terminated or revoked.  Performed at [facility], [address], [place],  [address]       Radiology Studies: DG C-Arm 1-60 Min-No Report  Result Date: 12/30/2019 Fluoroscopy was utilized by the requesting  physician.  No radiographic interpretation.       Scheduled Meds: . amLODipine  5 mg Oral Daily  . tamsulosin  0.4 mg Oral Daily   Continuous Infusions: . sodium chloride 75 mL/hr at 01/01/20 0810     LOS: 2 days    Flora Lipps, MD [facility] 01/01/2020,

## 2020-01-01 NOTE — Discharge Summary (Signed)
Physician Discharge Summary  Leslie Reese QBH:[id number] DOB: 18-Oct-1940 DOA: 12/29/2019  PCP: Egbert Garibaldi, PA-C  Admit date: 12/29/2019 Discharge date: 01/01/2020  Admitted From: Home  Discharge disposition: home   Recommendations for Outpatient Follow-Up:   . Follow up with your primary care provider in one week.  . Check CBC, BMP, magnesium in the next visit . Follow-up with your nephrologist Dr. Dimas Aguas at [facility] in 1 week. . Torsemide has been kept on hold until 01/06/2019.  Please reassess the need/renal function test prior to resuming it.   Discharge Diagnosis:   Principal Problem:   AKI (acute kidney injury) ([place]) Active Problems:   Hydronephrosis with renal and ureteral calculus obstruction   Essential hypertension   BPH (benign prostatic hyperplasia) CKD stage IV   Discharge Condition: Improved.  Diet recommendation: Low sodium, heart healthy.  Wound care: None.  Code status: Full.   History of Present Illness:   79 year old male with prior history of hypertension, BPH, renal stones presented to hospital with 2 days of right flank pain radiating to the groin.  In the ED, he was noted to have 3 mm obstructing stone in the right UVJ associated with severe right-sided hydronephrosis and hydroureter.  Urology was consulted and he underwent cystoscopy and stent placement on the right ureter on 12/30/2019.  Patient was then admitted hospital for evaluation and treatment.   Hospital Course:   Following conditions were addressed during hospitalization as listed below,  Right obstructing ureteral stone with right-sided hydronephrosis Patient underwent cystoscopy and ureteral stent placement by urology on 12/30/2019. UA without signs of infection.  Continue tamsulosin.  Continue monitor renal function as outpatient.  History of urolithiasis status post ESWL a month back.  Seen by urology and nephrology today and has been considered ok for discharge home  with nephrology and PCP follow up.  Acute kidney injury on background of CKD stage IV. Exacerbated by obstruction. Creatinine on admission 4.73.  Baseline creatinine of around 2 as per the patient.  Nephrology on board and the impression is AKI on CKD stage IV.   Protein to creatinine ratio of 0.8.  Persistently elevated creatinine levels despite ureteral stent.   Patient received IV fluids during hospitalization. Status post stent placement with resolution of hydronephrosis. At this time, nephrology recommends outpatient follow-up with patient's primary nephrologist.  Essential hypertension Currently on amlodipine. Resume on discharge  BPH Continue Flomax  Disposition.  At this time, patient is stable for disposition home with PCP and nephrology follow-up. Spoke with the patient's spouse at bedside.  Medical Consultants:    Urology  Nephrology.   Procedures:    Cystoscopy with ureteral stent placement on 12/30/2019 Subjective:   Today, patient was seen and examined at bedside.  Patient denies any urinary urgency, frequency or dysuria.  Discharge Exam:   Vitals:   01/01/20 0423 01/01/20 1410  BP: 118/83 140/86  Pulse: 65 66  Resp: 16 20  Temp: 98.5 F (36.9 C) (!) 97.4 F (36.3 C)  SpO2: 97% 100%   Vitals:   12/31/19 2100 01/01/20 0423 01/01/20 0500 01/01/20 1410  BP: 129/84 118/83  140/86  Pulse: 70 65  66  Resp: 16 16  20   Temp: 98.2 F (36.8 C) 98.5 F (36.9 C)  (!) 97.4 F (36.3 C)  TempSrc:    Oral  SpO2: 97% 97%  100%  Weight:   87.3 kg   Height:       General: Alert awake, not in obvious distress HENT:  pupils equally reacting to light,  No scleral pallor or icterus noted. Oral mucosa is moist.  Chest:  Clear breath sounds.  Diminished breath sounds bilaterally. No crackles or wheezes.  CVS: S1 &S2 heard. No murmur.  Regular rate and rhythm. Abdomen: Soft, nontender, nondistended.  Bowel sounds are heard.   Extremities: No cyanosis, clubbing or  edema.  Peripheral pulses are palpable. Psych: Alert, awake and oriented, normal mood CNS:  No cranial nerve deficits.  Power equal in all extremities.   Skin: Warm and dry.  No rashes noted.  The results of significant diagnostics from this hospitalization (including imaging, microbiology, ancillary and laboratory) are listed below for reference.     Diagnostic Studies:   CT ABDOMEN PELVIS WO CONTRAST  Result Date: 12/29/2019 CLINICAL DATA:  Right-sided abdominal pain. EXAM: CT ABDOMEN AND PELVIS WITHOUT CONTRAST TECHNIQUE: Multidetector CT imaging of the abdomen and pelvis was performed following the standard protocol without IV contrast. COMPARISON:  August 24, 2014 FINDINGS: Lower chest: No acute abnormality. Hepatobiliary: No focal liver abnormality is seen. No gallstones, gallbladder wall thickening, or biliary dilatation. Pancreas: Unremarkable. No pancreatic ductal dilatation or surrounding inflammatory changes. Spleen: Normal in size without focal abnormality. Adrenals/Urinary Tract: Adrenal glands are unremarkable. Kidneys are normal in size. A 1.7 cm x 1.2 cm cyst is seen along the anterolateral aspect of the mid right kidney. An additional 1.1 cm x 0.8 cm cyst is seen along the anterior aspect of the mid left kidney. A 3 mm obstructing renal stone is seen at the right UVJ. Moderate severity right-sided hydronephrosis and hydroureter are noted. Bladder is unremarkable. Stomach/Bowel: There is a small hiatal hernia. The appendix is not clearly identified. No evidence of bowel wall thickening, distention, or inflammatory changes. Noninflamed diverticula are seen throughout the large bowel. Vascular/Lymphatic: Aortic atherosclerosis. No enlarged abdominal or pelvic lymph nodes. 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 Other: No abdominal wall hernia or abnormality. No abdominopelvic ascites. Musculoskeletal: There is grade 1 anterolisthesis of the L5  vertebral body on S1. IMPRESSION: 1. 3 mm obstructing renal stone at the right UVJ. 2. Small hiatal hernia. 3. Colonic diverticulosis. 4. 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 5. 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 6. Aortic atherosclerosis. Aortic Atherosclerosis (ICD10-I70.0). Electronically Signed   By: Virgina Norfolk M.D.   On: 12/29/2019 23:34   DG C-Arm 1-60 Min-No Report  Result Date: 12/30/2019 Fluoroscopy was utilized by the requesting physician.  No radiographic interpretation.     Labs:   Basic Metabolic Panel: Recent Labs  Lab 12/29/19 2101 12/31/19 0514 01/01/20 0457  NA 135 140 139  K 3.7 5.1 4.4  CL 99 109 108  CO2 22 21* 21*  GLUCOSE 119* 141* 102*  BUN 45* 52* 61*  CREATININE 4.73* 4.83* 4.62*  CALCIUM 9.4 8.7* 8.2*   GFR Estimated Creatinine Clearance: 14.3 mL/min (A) (by C-G formula based on SCr of 4.62 mg/dL (H)). Liver Function Tests: Recent Labs  Lab 12/29/19 2101 12/31/19 0514  AST 16 10*  ALT 12 10  ALKPHOS 90 69  BILITOT 0.5 0.3  PROT 7.8 6.7  ALBUMIN 3.7 3.2*   Recent Labs  Lab 12/29/19 2101  LIPASE 15   No results for input(s): AMMONIA in the last 168 hours. Coagulation profile No results for input(s): INR, PROTIME in the last 168 hours.  CBC: Recent Labs  Lab 12/29/19 2101 12/31/19 0514 01/01/20 0457  WBC 15.4* 8.9 11.4*  HGB  11.1* 9.5* 8.8*  HCT 34.6* 31.3* 28.3*  MCV 83.0 86.7 85.2  PLT 407* 311 307   Cardiac Enzymes: No results for input(s): CKTOTAL, CKMB, CKMBINDEX, TROPONINI in the last 168 hours. BNP: Invalid input(s): POCBNP CBG: No results for input(s): GLUCAP in the last 168 hours. D-Dimer No results for input(s): DDIMER in the last 72 hours. Hgb A1c No results for input(s): HGBA1C in the last 72 hours. Lipid Profile No results for input(s): CHOL, HDL, LDLCALC, TRIG, CHOLHDL, LDLDIRECT in the last 72 hours. Thyroid function studies No results for input(s): TSH,  T4TOTAL, T3FREE, THYROIDAB in the last 72 hours.  Invalid input(s): FREET3 Anemia work up No results for input(s): VITAMINB12, FOLATE, FERRITIN, TIBC, IRON, RETICCTPCT in the last 72 hours. Microbiology Recent Results (from the past 240 hour(s))  Resp Panel by RT-PCR (Flu A&B, Covid) Nasopharyngeal Swab     Status: None   Collection Time: 12/29/19 11:14 PM   Specimen: Nasopharyngeal Swab; Nasopharyngeal(NP) swabs in vial transport medium  Result Value Ref Range Status   SARS Coronavirus 2 by RT PCR NEGATIVE NEGATIVE Final    Comment: (NOTE) SARS-CoV-2 target nucleic acids are NOT DETECTED.  The SARS-CoV-2 RNA is generally detectable in upper respiratory specimens during the acute phase of infection. The lowest concentration of SARS-CoV-2 viral copies this assay can detect is 138 copies/mL. A negative result does not preclude SARS-Cov-2 infection and should not be used as the sole basis for treatment or other patient management decisions. A negative result may occur with  improper specimen collection/handling, submission of specimen other than nasopharyngeal swab, presence of viral mutation(s) within the areas targeted by this assay, and inadequate number of viral copies(<138 copies/mL). A negative result must be combined with clinical observations, patient history, and epidemiological information. The expected result is Negative.  Fact Sheet for Patients:  EntrepreneurPulse.com.au  Fact Sheet for Healthcare Providers:  IncredibleEmployment.be  This test is no t yet approved or cleared by the [place] FDA and  has been authorized for detection and/or diagnosis of SARS-CoV-2 by FDA under an Emergency Use Authorization (EUA). This EUA will remain  in effect (meaning this test can be used) for the duration of the COVID-19 declaration under Section 564(b)(1) of the Act, 21 U.S.C.section 360bbb-3(b)(1), unless the authorization is terminated   or revoked sooner.       Influenza A by PCR NEGATIVE NEGATIVE Final   Influenza B by PCR NEGATIVE NEGATIVE Final    Comment: (NOTE) The Xpert Xpress SARS-CoV-2/FLU/RSV plus assay is intended as an aid in the diagnosis of influenza from Nasopharyngeal swab specimens and should not be used as a sole basis for treatment. Nasal washings and aspirates are unacceptable for Xpert Xpress SARS-CoV-2/FLU/RSV testing.  Fact Sheet for Patients: EntrepreneurPulse.com.au  Fact Sheet for Healthcare Providers: IncredibleEmployment.be  This test is not yet approved or cleared by the [place] FDA and has been authorized for detection and/or diagnosis of SARS-CoV-2 by FDA under an Emergency Use Authorization (EUA). This EUA will remain in effect (meaning this test can be used) for the duration of the COVID-19 declaration under Section 564(b)(1) of the Act, 21 U.S.C. section 360bbb-3(b)(1), unless the authorization is terminated or revoked.  Performed at [facility], [address] [place], [place] [address]      Discharge Instructions:   Discharge Instructions    Diet - low sodium heart healthy   Complete by: As directed    Discharge instructions   Complete by: As directed  Please follow-up with your primary care physician in 1 week.  Check blood work at that time.  Torsemide has been kept on hold until 01/06/19.  Please discuss with your primary care physician/nephrology prior to restarting it.   Increase activity slowly   Complete by: As directed      Allergies as of 01/01/2020   No Known Allergies     Medication List    TAKE these medications   albuterol 108 (90 Base) MCG/ACT inhaler Commonly known as: VENTOLIN HFA Inhale 1-2 puffs into the lungs every 6 (six) hours as needed for wheezing or shortness of breath.   amLODipine 5 MG tablet Commonly known as: NORVASC Take by mouth.   calcitRIOL 0.25 MCG capsule Commonly  known as: ROCALTROL Take 0.25 mcg by mouth daily. Mon, Wed, Friday   CALCIUM ASCORBATE PO Take 1 tablet by mouth. Every Monday, Wednesday, Friday   tamsulosin 0.4 MG Caps capsule Commonly known as: FLOMAX Take by mouth.   torsemide 20 MG tablet Commonly known as: DEMADEX Take 1 tablet (20 mg total) by mouth daily. Do not take until 1/3 or ok by your kidney or primary doctor Start taking on: January 06, 2020 What changed:   additional instructions  These instructions start on January 06, 2020. If you are unsure what to do until then, ask your doctor or other care provider.        Time coordinating discharge: 39 minutes  Signed:  Katherleen Folkes  [facility] 01/01/2020, 3:36 PM

## 2020-01-01 NOTE — Plan of Care (Signed)

## 2020-01-02 LAB — PROTEIN ELECTROPHORESIS, SERUM
A/G Ratio: 0.9 (ref 0.7–1.7)
Albumin ELP: 3.2 g/dL (ref 2.9–4.4)
Alpha-1-Globulin: 0.3 g/dL (ref 0.0–0.4)
Alpha-2-Globulin: 1 g/dL (ref 0.4–1.0)
Beta Globulin: 1 g/dL (ref 0.7–1.3)
Gamma Globulin: 1.1 g/dL (ref 0.4–1.8)
Globulin, Total: 3.4 g/dL (ref 2.2–3.9)
Total Protein ELP: 6.6 g/dL (ref 6.0–8.5)

## 2020-01-13 ENCOUNTER — Ambulatory Visit (INDEPENDENT_AMBULATORY_CARE_PROVIDER_SITE_OTHER): Payer: Medicare Other | Admitting: Ophthalmology

## 2020-01-13 ENCOUNTER — Encounter (INDEPENDENT_AMBULATORY_CARE_PROVIDER_SITE_OTHER): Payer: Self-pay | Admitting: Ophthalmology

## 2020-01-13 ENCOUNTER — Other Ambulatory Visit: Payer: Self-pay

## 2020-01-13 DIAGNOSIS — H2512 Age-related nuclear cataract, left eye: Secondary | ICD-10-CM

## 2020-01-13 DIAGNOSIS — H18231 Secondary corneal edema, right eye: Secondary | ICD-10-CM | POA: Diagnosis not present

## 2020-01-13 DIAGNOSIS — Z9889 Other specified postprocedural states: Secondary | ICD-10-CM

## 2020-01-13 DIAGNOSIS — H2701 Aphakia, right eye: Secondary | ICD-10-CM | POA: Diagnosis not present

## 2020-01-13 NOTE — Assessment & Plan Note (Addendum)
Follow-up with Dr. Virgina Evener as scheduled

## 2020-01-13 NOTE — Progress Notes (Signed)
01/13/2020     CHIEF COMPLAINT Patient presents for Retina Follow Up (1 Year f\u OU. FP and B Scan/Pt states he has trouble focusing. Pt states he sees his usual floaters. Denies FOL)   HISTORY OF PRESENT ILLNESS: Leslie Reese is a 80 y.o. male who presents to the clinic today for:   HPI    Retina Follow Up    Patient presents with  Other.  In both eyes.  Severity is moderate.  Duration of 1 year.  Since onset it is stable.  I, the attending physician,  performed the HPI with the patient and updated documentation appropriately. Additional comments: 1 Year f\u OU. FP and B Scan Pt states he has trouble focusing. Pt states he sees his usual floaters. Denies FOL       Last edited by Tilda Franco on 01/13/2020  2:17 PM. (History)      Referring physician: No referring provider defined for this encounter.  HISTORICAL INFORMATION:   Selected notes from the MEDICAL RECORD NUMBER       CURRENT MEDICATIONS: No current outpatient medications on file. (Ophthalmic Drugs)   No current facility-administered medications for this visit. (Ophthalmic Drugs)   Current Outpatient Medications (Other)  Medication Sig  . albuterol (VENTOLIN HFA) 108 (90 Base) MCG/ACT inhaler Inhale 1-2 puffs into the lungs every 6 (six) hours as needed for wheezing or shortness of breath.  Marland Kitchen amLODipine (NORVASC) 5 MG tablet Take by mouth.  . calcitRIOL (ROCALTROL) 0.25 MCG capsule Take 0.25 mcg by mouth daily. Mon, Wed, Friday  . CALCIUM ASCORBATE PO Take 1 tablet by mouth. Every Monday, Wednesday, Friday  . tamsulosin (FLOMAX) 0.4 MG CAPS capsule Take by mouth.  . torsemide (DEMADEX) 20 MG tablet Take 1 tablet (20 mg total) by mouth daily. Do not take until 1/3 or ok by your kidney or primary doctor   No current facility-administered medications for this visit. (Other)      REVIEW OF SYSTEMS:    ALLERGIES No Known Allergies  PAST MEDICAL HISTORY Past Medical History:  Diagnosis Date  .  Diverticulitis   . Renal disorder    kidney stones   Past Surgical History:  Procedure Laterality Date  . COLON RESECTION    . CYSTOSCOPY WITH RETROGRADE PYELOGRAM, URETEROSCOPY AND STENT PLACEMENT Right 12/30/2019   Procedure: CYSTOSCOPY WITH RETROGRADE PYELOGRAM, RIGHT URETERAL STENT PLACEMENT;  Surgeon: Janith Lima, MD;  Location: [facility];  Service: Urology;  Laterality: Right;    FAMILY HISTORY History reviewed. No pertinent family history.  SOCIAL HISTORY Social History   Tobacco Use  . Smoking status: Never Smoker  . Smokeless tobacco: Never Used  Substance Use Topics  . Alcohol use: Never  . Drug use: Never         OPHTHALMIC EXAM: Base Eye Exam    Visual Acuity (Snellen - Linear)      Right Left   Dist cc CF @ 3' 20/50 +   Dist ph cc NI 20/20 -1   Correction: Glasses       Tonometry (Tonopen, 2:23 PM)      Right Left   Pressure 17 12       Pupils      Dark Light Shape React APD   Right 6 6 Irregular Minimal None   Left 3 3 Round Minimal None       Visual Fields (Counting fingers)      Left Right    Full    Restrictions  Partial outer superior temporal, inferior temporal, superior nasal, inferior nasal deficiencies       Neuro/Psych    Oriented x3: Yes   Mood/Affect: Normal       Dilation    Both eyes: 1.0% Mydriacyl, 2.5% Phenylephrine @ 2:23 PM        Slit Lamp and Fundus Exam    External Exam      Right Left   External Normal Normal       Slit Lamp Exam      Right Left   Lids/Lashes Normal Normal   Conjunctiva/Sclera White and quiet White and quiet   Cornea 3+ Striae, 2+ Edema, Stromal edema, Epithelial edema Clear   Anterior Chamber Deep and quiet Deep and quiet   Iris Round and reactive Round and reactive   Lens Aphakia 2+ Nuclear sclerosis   Anterior Vitreous Normal Normal       Fundus Exam      Right Left   Posterior Vitreous Central vitreous floaters, Posterior vitreous detachment Central vitreous floaters   Disc  Peripapillary atrophy, no change Normal   C/D Ratio 0.3 0.3   Macula Attached, no details such as ERM Normal   Vessels Normal Normal   Periphery Normal Normal          IMAGING AND PROCEDURES  Imaging and Procedures for 01/13/20  Color Fundus Photography Optos - OU - Both Eyes       Right Eye Progression has been stable. Disc findings include increased cup to disc ratio. Macula : normal observations. Vessels : normal observations. Periphery : normal observations.   Left Eye Progression has no prior data. Disc findings include normal observations. Macula : normal observations. Vessels : normal observations. Periphery : normal observations.   Notes Peripapillary atrophy OD, macula attached.  Diffuse haze secondary to anterior segment medial opacity                ASSESSMENT/PLAN:  Nuclear sclerotic cataract of left eye The nature of cataract was discussed with the patient as well as the elective nature of surgery. The patient was reassured that surgery at a later date does not put the patient at risk for a worse outcome. It was emphasized that the need for surgery is dictated by the patient's quality of life as influenced by the cataract. Patient was instructed to maintain close follow up with their general eye care doctor.  Secondary corneal edema of right eye Follow-up with Dr. Virgina Evener as scheduled      ICD-10-CM   1. Secondary corneal edema of right eye  H18.231 Color Fundus Photography Optos - OU - Both Eyes  2. 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 Nuclear sclerotic cataract of left eye  H25.12   4. History of vitrectomy  Z98.890     1.  History of lens induced uveitis OD, status post vitrectomy 2016.  No residual disease  2.  Edema limits acuity OD, follow-up with Dr. Posey Pronto as scheduled  3.  Ophthalmic Meds Ordered this visit:  No orders of the defined types were placed in this encounter.      Return in about 1 year (around 01/12/2021) for DILATE OU,  OCT, COLOR FP.  There are no Patient Instructions on file for this visit.   Explained the diagnoses, plan, and follow up with the patient and they expressed understanding.  Patient expressed understanding of the importance of proper follow up care.   Clent Demark Keiran Sias M.D. Diseases & Surgery of  the Retina and Vitreous [facility] 01/13/20     Abbreviations: M myopia (nearsighted); A astigmatism; H hyperopia (farsighted); P presbyopia; Mrx spectacle prescription;  CTL contact lenses; OD right eye; OS left eye; OU both eyes  XT exotropia; ET esotropia; PEK punctate epithelial keratitis; PEE punctate epithelial erosions; DES dry eye syndrome; MGD meibomian gland dysfunction; ATs artificial tears; PFAT's preservative free artificial tears; [place] nuclear sclerotic cataract; PSC posterior subcapsular cataract; ERM epi-retinal membrane; PVD posterior vitreous detachment; RD retinal detachment; DM diabetes mellitus; DR diabetic retinopathy; NPDR non-proliferative diabetic retinopathy; PDR proliferative diabetic retinopathy; CSME clinically significant macular edema; DME diabetic macular edema; dbh dot blot hemorrhages; CWS cotton wool spot; POAG primary open angle glaucoma; C/D cup-to-disc ratio; HVF humphrey visual field; GVF goldmann visual field; OCT optical coherence tomography; IOP intraocular pressure; BRVO Branch retinal vein occlusion; CRVO central retinal vein occlusion; CRAO central retinal artery occlusion; BRAO branch retinal artery occlusion; RT retinal tear; SB scleral buckle; PPV pars plana vitrectomy; VH Vitreous hemorrhage; PRP panretinal laser photocoagulation; IVK intravitreal kenalog; VMT vitreomacular traction; MH Macular hole;  NVD neovascularization of the disc; NVE neovascularization elsewhere; AREDS age related eye disease study; ARMD age related macular degeneration; POAG primary open angle glaucoma; EBMD epithelial/anterior basement membrane dystrophy; ACIOL anterior  chamber intraocular lens; IOL intraocular lens; PCIOL posterior chamber intraocular lens; Phaco/IOL phacoemulsification with intraocular lens placement; [place] photorefractive keratectomy; LASIK laser assisted in situ keratomileusis; HTN hypertension; DM diabetes mellitus; COPD chronic obstructive pulmonary disease

## 2020-01-13 NOTE — Assessment & Plan Note (Signed)

## 2020-07-14 ENCOUNTER — Encounter (HOSPITAL_BASED_OUTPATIENT_CLINIC_OR_DEPARTMENT_OTHER): Payer: Self-pay | Admitting: *Deleted

## 2020-07-14 ENCOUNTER — Emergency Department (HOSPITAL_BASED_OUTPATIENT_CLINIC_OR_DEPARTMENT_OTHER)
Admission: EM | Admit: 2020-07-14 | Discharge: 2020-07-15 | Disposition: A | Discharge status: Home / Self Care | Payer: Medicare Other | Attending: Emergency Medicine | Admitting: Emergency Medicine

## 2020-07-14 ENCOUNTER — Emergency Department (HOSPITAL_BASED_OUTPATIENT_CLINIC_OR_DEPARTMENT_OTHER): Payer: Medicare Other

## 2020-07-14 ENCOUNTER — Other Ambulatory Visit: Payer: Self-pay

## 2020-07-14 DIAGNOSIS — G8929 Other chronic pain: Secondary | ICD-10-CM | POA: Insufficient documentation

## 2020-07-14 DIAGNOSIS — S60312A Abrasion of left thumb, initial encounter: Secondary | ICD-10-CM

## 2020-07-14 DIAGNOSIS — Z23 Encounter for immunization: Secondary | ICD-10-CM | POA: Insufficient documentation

## 2020-07-14 DIAGNOSIS — S0182XA Laceration with foreign body of other part of head, initial encounter: Secondary | ICD-10-CM | POA: Diagnosis not present

## 2020-07-14 DIAGNOSIS — S01111A Laceration without foreign body of right eyelid and periocular area, initial encounter: Secondary | ICD-10-CM | POA: Diagnosis not present

## 2020-07-14 DIAGNOSIS — W109XXA Fall (on) (from) unspecified stairs and steps, initial encounter: Secondary | ICD-10-CM | POA: Diagnosis not present

## 2020-07-14 DIAGNOSIS — Y9301 Activity, walking, marching and hiking: Secondary | ICD-10-CM | POA: Insufficient documentation

## 2020-07-14 DIAGNOSIS — S60012A Contusion of left thumb without damage to nail, initial encounter: Secondary | ICD-10-CM | POA: Diagnosis not present

## 2020-07-14 DIAGNOSIS — S5011XA Contusion of right forearm, initial encounter: Secondary | ICD-10-CM | POA: Diagnosis not present

## 2020-07-14 DIAGNOSIS — S80211A Abrasion, right knee, initial encounter: Secondary | ICD-10-CM

## 2020-07-14 DIAGNOSIS — R600 Localized edema: Secondary | ICD-10-CM | POA: Diagnosis not present

## 2020-07-14 DIAGNOSIS — T07XXXA Unspecified multiple injuries, initial encounter: Secondary | ICD-10-CM

## 2020-07-14 DIAGNOSIS — W010XXA Fall on same level from slipping, tripping and stumbling without subsequent striking against object, initial encounter: Secondary | ICD-10-CM

## 2020-07-14 DIAGNOSIS — S0181XA Laceration without foreign body of other part of head, initial encounter: Secondary | ICD-10-CM

## 2020-07-14 DIAGNOSIS — S0081XA Abrasion of other part of head, initial encounter: Secondary | ICD-10-CM

## 2020-07-14 DIAGNOSIS — S8001XA Contusion of right knee, initial encounter: Secondary | ICD-10-CM | POA: Insufficient documentation

## 2020-07-14 DIAGNOSIS — S0993XA Unspecified injury of face, initial encounter: Secondary | ICD-10-CM | POA: Diagnosis present

## 2020-07-14 NOTE — ED Triage Notes (Signed)
He missed a step walking down steps. He fell. Laceration above his right eye. Abrasion to the right side of his face. Hematoma and abrasion to his left hand. Bleeding controlled. No LOC.

## 2020-07-14 NOTE — ED Provider Notes (Signed)
[facility] Provider Note: Georgena Spurling, MD, FACEP  CSN: [id number] MRN: [id number] ARRIVAL: 07/14/20 at 1932 ROOM: [place]   HISTORY OF PRESENT ILLNESS  07/14/20 11:07 PM Leslie Reese is a 80 y.o. male who tripped over the bottom step of his front steps this evening about 6:30 PM.  He fell forward injuring primarily the right side of his face.  He has a laceration and abrasion to the right forehead and right temple.  He rates associated pain as a 5 out of 10, worse with palpation.  He did not lose consciousness.  He states his tetanus is up-to-date.  He has a few superficial bright abrasions elsewhere but no other significant injury.  He has been blind in the right eye since age 63.  He has chronic edema of the lower legs.  He is not wearing his hearing aids.   Past Medical History:  Diagnosis Date   Diverticulitis    Renal disorder    kidney stones    Past Surgical History:  Procedure Laterality Date   COLON RESECTION     COLON SURGERY     CYSTOSCOPY WITH INSERTION OF UROLIFT     CYSTOSCOPY WITH RETROGRADE PYELOGRAM, URETEROSCOPY AND STENT PLACEMENT Right 12/30/2019   Procedure: CYSTOSCOPY WITH RETROGRADE PYELOGRAM, RIGHT URETERAL STENT PLACEMENT;  Surgeon: Janith Lima, MD;  Location: [facility];  Service: Urology;  Laterality: Right;   LITHOTRIPSY      No family history on file.  Social History   Tobacco Use   Smoking status: Never   Smokeless tobacco: Never  Vaping Use   Vaping Use: Never used  Substance Use Topics   Alcohol use: Never   Drug use: Never    Prior to Admission medications   Medication Sig Start Date End Date Taking? Authorizing Provider  albuterol (VENTOLIN HFA) 108 (90 Base) MCG/ACT inhaler Inhale 1-2 puffs into the lungs every 6 (six) hours as needed for wheezing or shortness of breath.    [provider]  amLODipine (NORVASC) 5 MG tablet Take by mouth.    [provider]  calcitRIOL  (ROCALTROL) 0.25 MCG capsule Take 0.25 mcg by mouth daily. Mon, Wed, Friday    [provider]  CALCIUM ASCORBATE PO Take 1 tablet by mouth. Every Monday, Wednesday, Friday    [provider]  tamsulosin (FLOMAX) 0.4 MG CAPS capsule Take by mouth.    [provider]  torsemide (DEMADEX) 20 MG tablet Take 1 tablet (20 mg total) by mouth daily. Do not take until 1/3 or ok by your kidney or primary doctor 01/06/20   Flora Lipps, MD    Allergies Patient has no known allergies.   REVIEW OF SYSTEMS  Negative except as noted here or in the History of Present Illness.   PHYSICAL EXAMINATION  Initial Vital Signs Blood pressure (!) 150/88, pulse 85, temperature 97.9 F (36.6 C), temperature source Oral, resp. rate 19, height '5\' 11"'$  (1.803 m), weight 87.3 kg, SpO2 97 %.  Examination General: Well-developed, well-nourished male in no acute distress; appearance consistent with age of record HENT: normocephalic; no hemotympanums; laceration abrasion of right forehead and right temple:    Eyes: Right corneal opacity, irregular dilated pupil, and mild lateral strabismus; left pupil normal; extraocular muscles intact Neck: supple; no C-spine tenderness Heart: regular rate and rhythm Lungs: clear to auscultation bilaterally Abdomen: soft; nondistended; nontender; bowel sounds present Extremities: No deformity; full range of motion; 1+ edema of  the lower legs Neurologic: Awake, alert and oriented; motor function intact in all extremities and symmetric; no facial droop; hard of hearing Skin: Warm and dry; superficial abrasions and contusions of left thumb, proximal right forearm,  and right knee Psychiatric: Normal mood and affect   RESULTS  Summary of this visit's results, reviewed and interpreted by myself:   EKG Interpretation  Date/Time:    Ventricular Rate:    PR Interval:    QRS Duration:   QT Interval:    QTC Calculation:   R Axis:     Text  Interpretation:          Laboratory Studies: No results found for this or any previous visit (from the past 24 hour(s)). Imaging Studies: CT Head Wo Contrast  Result Date: 07/14/2020 CLINICAL DATA:  Fall, supraorbital laceration on the right. A brace into the right face EXAM: CT HEAD WITHOUT CONTRAST TECHNIQUE: Contiguous axial images were obtained from the base of the skull through the vertex without intravenous contrast. COMPARISON:  MRI 06/13/2019 FINDINGS: Brain: Focus of hypoattenuation in the right basal ganglia suggesting prior lacunar type infarct or prominent perivascular space. Comparison MRI demonstrates a 4 mm nodule of the right internal auditory canal, not well visualized on CT imaging. No evidence of acute infarction, hemorrhage, hydrocephalus, extra-axial collection, other visible mass lesion or mass effect. Symmetric prominence of the ventricles, cisterns and sulci compatible with parenchymal volume loss. Patchy areas of white matter hypoattenuation are most compatible with chronic microvascular angiopathy. Vascular: Atherosclerotic calcification of the carotid siphons and intradural vertebral arteries. No hyperdense vessel. Skull: Right supraorbital scalp laceration with crescentic soft tissue hematoma measuring up to 4 mm in maximal thickness. Few punctate radiodensities within the site of laceration could reflect radiodense debris. No visible calvarial fracture or sutural diastasis. No discernible facial bone fracture within the margins of imaging. Sinuses/Orbits: Thickening and secretions in the left maxillary sinus with hyperostotic changes and atelectatic features. Remaining paranasal sinuses and mastoid air cells are clear. Middle ear cavities are clear. Other: None. IMPRESSION: 1. Right frontal/supraorbital scalp laceration and swelling with punctate radiodensities concerning for debris at the site of laceration. Overlying bandaging is present. No calvarial or visible facial bone  fracture. 2. 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 3. No acute intracranial abnormality. 4. Background of parenchymal volume loss and microvascular angiopathy. 5. 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 6. Intracranial atherosclerosis. Electronically Signed   By: Lovena Le M.D.   On: 07/14/2020 21:33    ED COURSE and MDM  Nursing notes, initial and subsequent vitals signs, including pulse oximetry, reviewed and interpreted by myself.  Vitals:   07/14/20 1938 07/14/20 1939 07/14/20 2215  BP: (!) 154/92  (!) 150/88  Pulse: 84  85  Resp: 16  19  Temp: 97.9 F (36.6 C)    TempSrc: Oral    SpO2: 98%  97%  Weight:  87.3 kg   Height:  '5\' 11"'$  (1.803 m)    Medications - No data to display  Laceration closed by Rod Can, PA-C.  No evidence of significant intracranial or facial injury.    PROCEDURES  Procedures   ED DIAGNOSES     ICD-10-CM   1. Fall on same level from slipping, tripping or stumbling, initial encounter  W01.0XXA     2. Laceration of forehead, initial encounter  S01.81XA     3. Abrasion of face, initial encounter  S00.81XA     4.  Abrasion of left thumb, initial encounter  S60.312A     5. Abrasion of right knee, initial encounter  S80.211A     6. Contusion of multiple sites  T07.Encarnacion Chu, MD 07/15/20 [phone]

## 2020-07-15 DIAGNOSIS — S0182XA Laceration with foreign body of other part of head, initial encounter: Secondary | ICD-10-CM | POA: Diagnosis not present

## 2020-07-15 MED ORDER — TETANUS-DIPHTH-ACELL PERTUSSIS 5-2.5-18.5 LF-MCG/0.5 IM SUSY
0.5000 mL | PREFILLED_SYRINGE | Freq: Once | INTRAMUSCULAR | Status: AC
  Administered 2020-07-15: 0.5 mL via INTRAMUSCULAR
  Filled 2020-07-15: qty 0.5

## 2020-07-15 MED ORDER — LIDOCAINE-EPINEPHRINE (PF) 2 %-1:200000 IJ SOLN
10.0000 mL | Freq: Once | INTRAMUSCULAR | Status: AC
  Administered 2020-07-15: 10 mL via INTRADERMAL
  Filled 2020-07-15: qty 20

## 2020-07-15 NOTE — ED Notes (Signed)
Wounds dressed.

## 2020-07-15 NOTE — ED Provider Notes (Signed)
LACERATION REPAIR Performed by: Emeline Darling Authorized by: Emeline Darling Consent: Verbal consent obtained. Risks and benefits: risks, benefits and alternatives were discussed Consent given by: patient Patient identity confirmed: provided demographic data Prepped and Draped in normal sterile fashion Wound explored  Laceration Location: Right Eyebrow., temple  Laceration Length: 2cm over the eyebrow, 0.5 cm lac over the right temple  Foreign Bodies seen - small pebbles. Extensive irrigation and removal of debris  Anesthesia: local infiltration   Local anesthetic: lidocaine 2% with epinephrine  Anesthetic total: 4 ml  Irrigation method: syringe Amount of cleaning: extensive  Skin closure: suture, 5-0 prolene  Number of sutures: 4  Technique: simple interrupted  Patient tolerance: Patient tolerated the procedure well with no immediate complications.   This chart was dictated using voice recognition software, Dragon. Despite the best efforts of this provider to proofread and correct errors, errors may still occur which can change documentation meaning.    Aura Dials 07/15/20 0047    Shanon Rosser, MD 07/15/20 [phone]

## 2021-01-12 ENCOUNTER — Encounter (INDEPENDENT_AMBULATORY_CARE_PROVIDER_SITE_OTHER): Payer: Self-pay | Admitting: Ophthalmology

## 2021-01-12 ENCOUNTER — Ambulatory Visit (INDEPENDENT_AMBULATORY_CARE_PROVIDER_SITE_OTHER): Payer: Medicare Other | Admitting: Ophthalmology

## 2021-01-12 ENCOUNTER — Other Ambulatory Visit: Payer: Self-pay

## 2021-01-12 DIAGNOSIS — H18231 Secondary corneal edema, right eye: Secondary | ICD-10-CM

## 2021-01-12 DIAGNOSIS — H2701 Aphakia, right eye: Secondary | ICD-10-CM | POA: Diagnosis not present

## 2021-01-12 DIAGNOSIS — H43392 Other vitreous opacities, left eye: Secondary | ICD-10-CM | POA: Insufficient documentation

## 2021-01-12 DIAGNOSIS — H2512 Age-related nuclear cataract, left eye: Secondary | ICD-10-CM | POA: Diagnosis not present

## 2021-01-12 DIAGNOSIS — H43822 Vitreomacular adhesion, left eye: Secondary | ICD-10-CM | POA: Diagnosis not present

## 2021-01-12 DIAGNOSIS — H43812 Vitreous degeneration, left eye: Secondary | ICD-10-CM | POA: Insufficient documentation

## 2021-01-12 HISTORY — DX: Vitreous degeneration, left eye: H43.812

## 2021-01-12 NOTE — Progress Notes (Signed)
01/12/2021     CHIEF COMPLAINT Patient presents for  Chief Complaint  Patient presents with   Retina Follow Up      HISTORY OF PRESENT ILLNESS: Leslie Reese is a 81 y.o. male who presents to the clinic today for:   HPI     Retina Follow Up           Diagnosis: Other   Laterality: both eyes   Onset: 1 year ago   Severity: mild   Duration: 1 year   Course: gradually worsening         Comments   1 year fu ou and oct/fp  Pt states, "My good eye is my left eye and I feel like it is getting a little worse. My vision is pretty blurry. I am going to have to have my glasses changed, I think. " Pt denies any new FOL or floating spots.          Last edited by Kendra Opitz, COA on 01/12/2021 12:57 PM.      Referring physician: Calvert Cantor, MD [address] [place],  [place] [address]  HISTORICAL INFORMATION:   Selected notes from the MEDICAL RECORD NUMBER       CURRENT MEDICATIONS: No current outpatient medications on file. (Ophthalmic Drugs)   No current facility-administered medications for this visit. (Ophthalmic Drugs)   Current Outpatient Medications (Other)  Medication Sig   albuterol (VENTOLIN HFA) 108 (90 Base) MCG/ACT inhaler Inhale 1-2 puffs into the lungs every 6 (six) hours as needed for wheezing or shortness of breath.   amLODipine (NORVASC) 5 MG tablet Take by mouth.   calcitRIOL (ROCALTROL) 0.25 MCG capsule Take 0.25 mcg by mouth daily. Mon, Wed, Friday   CALCIUM ASCORBATE PO Take 1 tablet by mouth. Every Monday, Wednesday, Friday   tamsulosin (FLOMAX) 0.4 MG CAPS capsule Take by mouth.   torsemide (DEMADEX) 20 MG tablet Take 1 tablet (20 mg total) by mouth daily. Do not take until 1/3 or ok by your kidney or primary doctor   No current facility-administered medications for this visit. (Other)      REVIEW OF SYSTEMS:    ALLERGIES No Known Allergies  PAST MEDICAL HISTORY Past Medical History:  Diagnosis Date    Diverticulitis    Posterior vitreous detachment of left eye 01/12/2021   Renal disorder    kidney stones   Past Surgical History:  Procedure Laterality Date   COLON RESECTION     COLON SURGERY     CYSTOSCOPY WITH INSERTION OF UROLIFT     CYSTOSCOPY WITH RETROGRADE PYELOGRAM, URETEROSCOPY AND STENT PLACEMENT Right 12/30/2019   Procedure: CYSTOSCOPY WITH RETROGRADE PYELOGRAM, RIGHT URETERAL STENT PLACEMENT;  Surgeon: Janith Lima, MD;  Location: [facility];  Service: Urology;  Laterality: Right;   LITHOTRIPSY      FAMILY HISTORY History reviewed. No pertinent family history.  SOCIAL HISTORY Social History   Tobacco Use   Smoking status: Never   Smokeless tobacco: Never  Vaping Use   Vaping Use: Never used  Substance Use Topics   Alcohol use: Never   Drug use: Never         OPHTHALMIC EXAM:  Base Eye Exam     Visual Acuity (ETDRS)       Right Left   Dist cc HM 20/50 -1   Dist ph cc  20/20 -2         Tonometry (Tonopen, 1:01 PM)  Right Left   Pressure 13 12         Pupils       Pupils Dark Light Shape React APD   Right PERRL 6 6 Irregular Minimal None   Left PERRL 3 3 Round Minimal None         Visual Fields       Left Right    Full    Restrictions  Partial outer superior temporal, inferior temporal, superior nasal, inferior nasal deficiencies         Extraocular Movement       Right Left    Full, Ortho Full, Ortho         Neuro/Psych     Oriented x3: Yes   Mood/Affect: Normal         Dilation     Both eyes: 1.0% Mydriacyl, 2.5% Phenylephrine @ 1:01 PM           Slit Lamp and Fundus Exam     External Exam       Right Left   External Normal Normal         Slit Lamp Exam       Right Left   Lids/Lashes Normal Normal   Conjunctiva/Sclera White and quiet White and quiet   Cornea 3+ Striae, 2+ Edema, Stromal edema, Epithelial edema Clear   Anterior Chamber Deep and quiet Deep and quiet   Iris Round and reactive  Round and reactive   Lens Aphakia 2+ Nuclear sclerosis   Anterior Vitreous Normal Normal         Fundus Exam       Right Left   Posterior Vitreous Central vitreous floaters, Posterior vitreous detachment Central vitreous floaters   Disc Peripapillary atrophy, no change Normal   C/D Ratio 0.3 0.3   Macula Attached, no details such as ERM Normal   Vessels Normal Normal   Periphery Normal Normal            IMAGING AND PROCEDURES  Imaging and Procedures for 01/25/21  OCT, Retina - OU - Both Eyes       Left Eye Quality was good. Scan locations included subfoveal. Central Foveal Thickness: 338. Findings include normal foveal contour, vitreomacular adhesion .   Notes OD, poor details     Color Fundus Photography Optos - OU - Both Eyes       Right Eye Progression has been stable. Disc findings include increased cup to disc ratio. Macula : normal observations. Vessels : normal observations. Periphery : normal observations.   Left Eye Progression has no prior data. Disc findings include normal observations. Macula : normal observations. Vessels : normal observations. Periphery : normal observations.   Notes Peripapillary atrophy OD, macula attached.  Diffuse haze secondary to anterior segment media opacity  Vitreous debris noted centrally OS from PVD             ASSESSMENT/PLAN:  Nuclear sclerotic cataract of left eye OS moderate NSC changes, follow-up with [facility] as scheduled  Secondary corneal edema of right eye No change, follow Dr. Bing Plume, [facility] as scheduled  Posterior vitreous detachment of left eye Physiologic OS no holes or tears  Vitreous floaters, left Central vitreous floaters in the left eye, no change over time  Vitreomacular adhesion of left eye OS appears to have a vitreous detachment but OCT confirms VMA of the cortical hyaloid remains, no impact on macula     ICD-10-CM   1. 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 Nuclear sclerotic cataract of left eye  H25.12     4. Posterior vitreous detachment of left eye  H43.812     5. Vitreous floaters, left  H43.392     6. Vitreomacular adhesion of left eye  H43.822       1.  OS with vitreous floaters no impact on acuity  2.  Progressive NSC changes OS follow-up with [facility] as scheduled  3.  Ophthalmic Meds Ordered this visit:  No orders of the defined types were placed in this encounter.      Return in about 1 year (around 01/12/2022) for DILATE OU, COLOR FP, OCT.  There are no Patient Instructions on file for this visit.   Explained the diagnoses, plan, and follow up with the patient and they expressed understanding.  Patient expressed understanding of the importance of proper follow up care.   Clent Demark Jaggar Benko M.D. Diseases & Surgery of the Retina and Vitreous [facility]ina [facility] Di[facility] [place] 01/25/21     Abbreviations: M myopia (nearsighted); A astigmatism; H hyperopia (farsighted); P presbyopia; Mrx spectacle prescription;  CTL contact lenses; OD right eye; OS left eye; OU both eyes  XT exotropia; ET esotropia; PEK punctate epithelial keratitis; PEE punctate epithelial erosions; DES dry eye syndrome; MGD meibomian gland dysfunction; ATs artificial tears; PFAT's preservative free artificial tears; [place] nuclear sclerotic cataract; PSC posterior subcapsular cataract; ERM epi-retinal membrane; PVD posterior vitreous detachment; RD retinal detachment; DM diabetes mellitus; DR diabetic retinopathy; NPDR non-proliferative diabetic retinopathy; PDR proliferative diabetic retinopathy; CSME clinically significant macular edema; DME diabetic macular edema; dbh dot blot hemorrhages; CWS cotton wool spot; POAG primary open angle glaucoma; C/D cup-to-disc ratio; HVF humphrey visual  field; GVF goldmann visual field; OCT optical coherence tomography; IOP intraocular pressure; BRVO Branch retinal vein occlusion; CRVO central retinal vein occlusion; CRAO central retinal artery occlusion; BRAO branch retinal artery occlusion; RT retinal tear; SB scleral buckle; PPV pars plana vitrectomy; VH Vitreous hemorrhage; PRP panretinal laser photocoagulation; IVK intravitreal kenalog; VMT vitreomacular traction; MH Macular hole;  NVD neovascularization of the disc; NVE neovascularization elsewhere; AREDS age related eye disease study; ARMD age related macular degeneration; POAG primary open angle glaucoma; EBMD epithelial/anterior basement membrane dystrophy; ACIOL anterior chamber intraocular lens; IOL intraocular lens; PCIOL posterior chamber intraocular lens; Phaco/IOL phacoemulsification with intraocular lens placement; [place] photorefractive keratectomy; LASIK laser assisted in situ keratomileusis; HTN hypertension; DM diabetes mellitus; COPD chronic obstructive pulmonary disease

## 2021-01-12 NOTE — Assessment & Plan Note (Signed)
Physiologic OS no holes or tears 

## 2021-01-12 NOTE — Assessment & Plan Note (Signed)
Central vitreous floaters in the left eye, no change over time

## 2021-01-12 NOTE — Assessment & Plan Note (Signed)
No change, follow Dr. Bing Plume, [facility] as scheduled

## 2021-01-12 NOTE — Assessment & Plan Note (Signed)
OS appears to have a vitreous detachment but OCT confirms VMA of the cortical hyaloid remains, no impact on macula

## 2021-01-12 NOTE — Assessment & Plan Note (Signed)
OS moderate NSC changes, follow-up with [facility] as scheduled

## 2021-07-12 ENCOUNTER — Emergency Department (HOSPITAL_BASED_OUTPATIENT_CLINIC_OR_DEPARTMENT_OTHER)
Admission: EM | Admit: 2021-07-12 | Discharge: 2021-07-12 | Disposition: A | Discharge status: Home / Self Care | Payer: Medicare Other | Attending: Emergency Medicine | Admitting: Emergency Medicine

## 2021-07-12 ENCOUNTER — Encounter (HOSPITAL_BASED_OUTPATIENT_CLINIC_OR_DEPARTMENT_OTHER): Payer: Self-pay

## 2021-07-12 ENCOUNTER — Emergency Department (HOSPITAL_BASED_OUTPATIENT_CLINIC_OR_DEPARTMENT_OTHER): Payer: Medicare Other

## 2021-07-12 ENCOUNTER — Other Ambulatory Visit: Payer: Self-pay

## 2021-07-12 DIAGNOSIS — K5792 Diverticulitis of intestine, part unspecified, without perforation or abscess without bleeding: Secondary | ICD-10-CM | POA: Insufficient documentation

## 2021-07-12 DIAGNOSIS — R109 Unspecified abdominal pain: Secondary | ICD-10-CM | POA: Diagnosis present

## 2021-07-12 DIAGNOSIS — N189 Chronic kidney disease, unspecified: Secondary | ICD-10-CM | POA: Insufficient documentation

## 2021-07-12 LAB — HEPATIC FUNCTION PANEL
ALT: 16 U/L (ref 0–44)
AST: 15 U/L (ref 15–41)
Albumin: 3.4 g/dL — ABNORMAL LOW (ref 3.5–5.0)
Alkaline Phosphatase: 95 U/L (ref 38–126)
Bilirubin, Direct: <0.1 mg/dL (ref 0.0–0.2)
Total Bilirubin: 0.7 mg/dL (ref 0.3–1.2)
Total Protein: 7.9 g/dL (ref 6.5–8.1)

## 2021-07-12 LAB — CBC
HCT: 28.4 % — ABNORMAL LOW (ref 39.0–52.0)
Hemoglobin: 9 g/dL — ABNORMAL LOW (ref 13.0–17.0)
MCH: 26.5 pg (ref 26.0–34.0)
MCHC: 31.7 g/dL (ref 30.0–36.0)
MCV: 83.8 fL (ref 80.0–100.0)
Platelets: 281 10*3/uL (ref 150–400)
RBC: 3.39 MIL/uL — ABNORMAL LOW (ref 4.22–5.81)
RDW: 15.4 % (ref 11.5–15.5)
WBC: 9.3 10*3/uL (ref 4.0–10.5)
nRBC: 0 % (ref 0.0–0.2)

## 2021-07-12 LAB — BASIC METABOLIC PANEL WITH GFR
Anion gap: 9 (ref 5–15)
BUN: 69 mg/dL — ABNORMAL HIGH (ref 8–23)
CO2: 23 mmol/L (ref 22–32)
Calcium: 8.7 mg/dL — ABNORMAL LOW (ref 8.9–10.3)
Chloride: 102 mmol/L (ref 98–111)
Creatinine, Ser: 5.55 mg/dL — ABNORMAL HIGH (ref 0.61–1.24)
GFR, Estimated: 10 mL/min — ABNORMAL LOW
Glucose, Bld: 117 mg/dL — ABNORMAL HIGH (ref 70–99)
Potassium: 3.7 mmol/L (ref 3.5–5.1)
Sodium: 134 mmol/L — ABNORMAL LOW (ref 135–145)

## 2021-07-12 LAB — URINALYSIS, MICROSCOPIC (REFLEX)

## 2021-07-12 LAB — URINALYSIS, ROUTINE W REFLEX MICROSCOPIC
Bilirubin Urine: NEGATIVE
Glucose, UA: NEGATIVE mg/dL
Ketones, ur: NEGATIVE mg/dL
Leukocytes,Ua: NEGATIVE
Nitrite: NEGATIVE
Protein, ur: 30 mg/dL — AB
Specific Gravity, Urine: 1.01 (ref 1.005–1.030)
pH: 6 (ref 5.0–8.0)

## 2021-07-12 LAB — LIPASE, BLOOD: Lipase: 20 U/L (ref 11–51)

## 2021-07-12 MED ORDER — MORPHINE SULFATE (PF) 4 MG/ML IV SOLN
4.0000 mg | Freq: Once | INTRAVENOUS | Status: AC
  Administered 2021-07-12: 4 mg via INTRAVENOUS
  Filled 2021-07-12: qty 1

## 2021-07-12 MED ORDER — AMOXICILLIN-POT CLAVULANATE 500-125 MG PO TABS: 1.0000 | ORAL_TABLET | ORAL | 0 refills | Status: AC

## 2021-07-12 MED ORDER — SODIUM CHLORIDE 0.9 % IV SOLN
2.0000 g | Freq: Once | INTRAVENOUS | Status: AC
  Administered 2021-07-12: 2 g via INTRAVENOUS
  Filled 2021-07-12: qty 20

## 2021-07-12 NOTE — ED Provider Notes (Signed)
[facility] EMERGENCY DEPARTMENT Provider Note   CSN: [id number] Arrival date & time: 07/12/21  1559     History  Chief Complaint  Patient presents with   Flank Pain    Leslie Reese is a 81 y.o. male.   Flank Pain     Pt states he has been having some back pain in his torso area for a while.  He has been seeing a Restaurant manager, fast food.  In the last couple of days he has had more intense pain in the right flank.  Pain doesn't radiate.  No dysuria.  No vomiting or diarrhea.  Lying on the back helps, it gets worse lying on the side.  It increases when trying to get up.  Home Medications Prior to Admission medications   Medication Sig Start Date End Date Taking? Authorizing Provider  amoxicillin-clavulanate (AUGMENTIN) 500-125 MG tablet Take 1 tablet (500 mg total) by mouth daily for 7 days. 07/12/21 07/19/21 Yes Dorie Rank, MD  albuterol (VENTOLIN HFA) 108 (90 Base) MCG/ACT inhaler Inhale 1-2 puffs into the lungs every 6 (six) hours as needed for wheezing or shortness of breath.    [provider]  amLODipine (NORVASC) 5 MG tablet Take by mouth.    [provider]  calcitRIOL (ROCALTROL) 0.25 MCG capsule Take 0.25 mcg by mouth daily. Mon, Wed, Friday    [provider]  CALCIUM ASCORBATE PO Take 1 tablet by mouth. Every Monday, Wednesday, Friday    [provider]  tamsulosin (FLOMAX) 0.4 MG CAPS capsule Take by mouth.    [provider]  torsemide (DEMADEX) 20 MG tablet Take 1 tablet (20 mg total) by mouth daily. Do not take until 1/3 or ok by your kidney or primary doctor 01/06/20   Flora Lipps, MD      Allergies    Statins, Sunflower oil, and Tape    Review of Systems   Review of Systems  Genitourinary:  Positive for flank pain.    Physical Exam Updated Vital Signs BP 121/77   Pulse 70   Temp 97.9 F (36.6 C) (Oral)   Resp 17   Ht 1.803 m (5\' 11" )   Wt 79.4 kg   SpO2 97%   BMI 24.41 kg/m  Physical Exam Vitals and  nursing note reviewed.  Constitutional:      General: He is not in acute distress.    Appearance: He is well-developed.  HENT:     Head: Normocephalic and atraumatic.     Right Ear: External ear normal.     Left Ear: External ear normal.  Eyes:     General: No scleral icterus.       Right eye: No discharge.        Left eye: No discharge.     Conjunctiva/sclera: Conjunctivae normal.  Neck:     Trachea: No tracheal deviation.  Cardiovascular:     Rate and Rhythm: Normal rate and regular rhythm.  Pulmonary:     Effort: Pulmonary effort is normal. No respiratory distress.     Breath sounds: Normal breath sounds. No stridor.  Abdominal:     General: There is no distension.     Tenderness: There is right CVA tenderness.  Musculoskeletal:        General: No swelling or deformity.     Cervical back: Neck supple.  Skin:    General: Skin is warm and dry.     Findings: No rash.  Neurological:     Mental Status: He is  alert.     Cranial Nerves: Cranial nerve deficit: no gross deficits.     ED Results / Procedures / Treatments   Labs (all labs ordered are listed, but only abnormal results are displayed) Labs Reviewed  URINALYSIS, ROUTINE W REFLEX MICROSCOPIC - Abnormal; Notable for the following components:      Result Value   Hgb urine dipstick SMALL (*)    Protein, ur 30 (*)    All other components within normal limits  CBC - Abnormal; Notable for the following components:   RBC 3.39 (*)    Hemoglobin 9.0 (*)    HCT 28.4 (*)    All other components within normal limits  BASIC METABOLIC PANEL - Abnormal; Notable for the following components:   Sodium 134 (*)    Glucose, Bld 117 (*)    BUN 69 (*)    Creatinine, Ser 5.55 (*)    Calcium 8.7 (*)    GFR, Estimated 10 (*)    All other components within normal limits  URINALYSIS, MICROSCOPIC (REFLEX) - Abnormal; Notable for the following components:   Bacteria, UA RARE (*)    All other components within normal limits  HEPATIC  FUNCTION PANEL - Abnormal; Notable for the following components:   Albumin 3.4 (*)    All other components within normal limits  LIPASE, BLOOD    EKG None  Radiology CT Renal Stone Study  Result Date: 07/12/2021 CLINICAL DATA:  RIGHT flank pain, suspected kidney stone EXAM: CT ABDOMEN AND PELVIS WITHOUT CONTRAST TECHNIQUE: Multidetector CT imaging of the abdomen and pelvis was performed following the standard protocol without IV contrast. RADIATION DOSE REDUCTION: This exam was performed according to the departmental dose-optimization program which includes automated exposure control, adjustment of the mA and/or kV according to patient size and/or use of iterative reconstruction technique. COMPARISON:  12/29/2019 FINDINGS: Lower chest: Minimal scarring medial LEFT lower lobe. Dependent atelectasis RIGHT lower lobe. Hepatobiliary: Gallbladder and liver normal appearance Pancreas: Fatty replacement of pancreas Spleen: Normal appearance Adrenals/Urinary Tract: Adrenal glands normal appearance. BILATERAL renal cysts up to 17 mm diameter again seen; no follow-up imaging recommended. No additional renal mass, hydronephrosis or hydroureter. Tiny nonobstructing calculus at inferior pole LEFT kidney. Stomach/Bowel: Sigmoid diverticulosis. Wall thickening of sigmoid colon extending into rectum could either represent colitis or diverticulitis. Prior sigmoid resection with anastomotic staple line. Suspected small hiatal hernia. Stomach and remaining bowel loops normal appearance. Vascular/Lymphatic: Atherosclerotic calcifications aorta and iliac arteries without aneurysm. No adenopathy. Reproductive: Surgical clips within prostate gland consistent with urolift. Other: Small supraumbilical ventral hernia containing fat. No free air or free fluid. Musculoskeletal: No acute osseous findings. IMPRESSION: Wall thickening at sigmoid colon into rectum either representing colitis or diverticulitis. Sigmoid diverticulosis  with prior sigmoid resection. Small supraumbilical ventral hernia containing fat. Electronically Signed   By: Lavonia Dana M.D.   On: 07/12/2021 20:30    Procedures Procedures    Medications Ordered in ED Medications  cefTRIAXone (ROCEPHIN) 2 g in sodium chloride 0.9 % 100 mL IVPB (has no administration in time range)  morphine (PF) 4 MG/ML injection 4 mg (4 mg Intravenous Given 07/12/21 2032)    ED Course/ Medical Decision Making/ A&P Clinical Course as of 07/12/21 2222  Mon Jul 12, 2021  1951 CBC(!) Anemia  [JK]  4128 Basic metabolic panel(!) Creatinine increased from 1 year ago.  Pt has history of CKD.  Not on dialysis [JK]  1952 Ua does show some blood [JK]  2035 CT Renal Stone Study  Wall thickening in colon suggesting either diverticulitis or colitis [JK]    Clinical Course User Index [JK] Dorie Rank, MD                           Medical Decision Making Problems Addressed: Chronic kidney disease, unspecified CKD stage: chronic illness or injury Diverticulitis: acute illness or injury that poses a threat to life or bodily functions  Amount and/or Complexity of Data Reviewed Labs: ordered. Decision-making details documented in ED Course. Radiology: ordered. Decision-making details documented in ED Course.  Risk Prescription drug management.    Patient presented to the ED for evaluation of flank pain.  Patient concerned about kidney stones.  Laboratory tests showed his chronic anemia and chronic kidney disease.  Urinalysis showed few red blood cells.  Lipase is normal.  CT scan was performed and does not show any signs of ureterolithiasis but rather findings suggestive of either diverticulitis or colitis.  Patient is afebrile.  He is not having any difficulty eating or drinking we will start him on a course of antibiotics.  Augmentin dosed for his renal dysfunction.  Patient instructed return for fevers vomiting worsening symptoms.   Evaluation and diagnostic testing in  the emergency department does not suggest an emergent condition requiring admission or immediate intervention beyond what has been performed at this time.  The patient is safe for discharge and has been instructed to return immediately for worsening symptoms, change in symptoms or any other concerns.        Final Clinical Impression(s) / ED Diagnoses Final diagnoses:  Diverticulitis  Chronic kidney disease, unspecified CKD stage    Rx / DC Orders ED Discharge Orders          Ordered    amoxicillin-clavulanate (AUGMENTIN) 500-125 MG tablet  Every 24 hours        07/12/21 2208              Dorie Rank, MD 07/12/21 2222

## 2021-07-12 NOTE — ED Notes (Signed)
Pt. Reports pain in the R flank area that started yesterday worse today than yesterday.  NO issues with urination.  Has history of kidney stones.

## 2021-07-12 NOTE — Discharge Instructions (Signed)
Take the medications as prescribed.  Follow-up with your primary care doctor to be rechecked.  Return for fevers vomiting worsening symptoms

## 2021-07-12 NOTE — ED Triage Notes (Signed)
C/o right flank pain since yesterday. Hx of kidney stones and kidney disease.

## 2022-01-13 ENCOUNTER — Encounter (INDEPENDENT_AMBULATORY_CARE_PROVIDER_SITE_OTHER): Payer: Medicare Other | Admitting: Ophthalmology

## 2022-02-14 ENCOUNTER — Emergency Department (HOSPITAL_BASED_OUTPATIENT_CLINIC_OR_DEPARTMENT_OTHER): Payer: Medicare Other

## 2022-02-14 ENCOUNTER — Emergency Department (HOSPITAL_BASED_OUTPATIENT_CLINIC_OR_DEPARTMENT_OTHER)
Admission: EM | Admit: 2022-02-14 | Discharge: 2022-02-14 | Disposition: A | Discharge status: Home / Self Care | Payer: Medicare Other | Attending: Emergency Medicine | Admitting: Emergency Medicine

## 2022-02-14 ENCOUNTER — Other Ambulatory Visit: Payer: Self-pay

## 2022-02-14 DIAGNOSIS — W109XXA Fall (on) (from) unspecified stairs and steps, initial encounter: Secondary | ICD-10-CM | POA: Diagnosis not present

## 2022-02-14 DIAGNOSIS — I120 Hypertensive chronic kidney disease with stage 5 chronic kidney disease or end stage renal disease: Secondary | ICD-10-CM | POA: Insufficient documentation

## 2022-02-14 DIAGNOSIS — Z992 Dependence on renal dialysis: Secondary | ICD-10-CM | POA: Insufficient documentation

## 2022-02-14 DIAGNOSIS — S0121XA Laceration without foreign body of nose, initial encounter: Secondary | ICD-10-CM

## 2022-02-14 DIAGNOSIS — N186 End stage renal disease: Secondary | ICD-10-CM | POA: Diagnosis not present

## 2022-02-14 DIAGNOSIS — R519 Headache, unspecified: Secondary | ICD-10-CM | POA: Diagnosis not present

## 2022-02-14 DIAGNOSIS — S60511A Abrasion of right hand, initial encounter: Secondary | ICD-10-CM | POA: Insufficient documentation

## 2022-02-14 DIAGNOSIS — Z79899 Other long term (current) drug therapy: Secondary | ICD-10-CM | POA: Insufficient documentation

## 2022-02-14 MED ORDER — LIDOCAINE HCL 1 % IJ SOLN
INTRAMUSCULAR | Status: AC
  Administered 2022-02-14: 20 mL
  Filled 2022-02-14: qty 20

## 2022-02-14 NOTE — ED Provider Notes (Signed)
[facility] Provider Note  CSN: [id number] Arrival date & time: 02/14/22 1017  Chief Complaint(s) Fall  HPI Leslie Reese is a 82 y.o. male history of BPH, end-stage renal disease on peritoneal dialysis presenting to the emergency department with fall.  Patient reports he missed the bottom step and then fell forward.  He fell onto his nose and both hands.  He reports that his primary pain is in the left middle finger and the right wrist.  He denies pain in the head or neck.  He has had significant bleeding from the nose since this occurred.  Denies taking any blood thinners.  No chest or abdominal pain.  Has been ambulatory.  No pain in the lower extremities.  No loss of consciousness.   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 Authorizing Provider  albuterol (VENTOLIN HFA) 108 (90 Base) MCG/ACT inhaler Inhale 1-2 puffs into the lungs every 6 (six) hours as needed for wheezing or shortness of breath.    [provider]  amLODipine (NORVASC) 5 MG tablet Take by mouth.    [provider]  calcitRIOL (ROCALTROL) 0.25 MCG capsule Take 0.25 mcg by mouth daily. Mon, Wed, Friday    [provider]  CALCIUM ASCORBATE PO Take 1 tablet by mouth. Every Monday, Wednesday, Friday    [provider]  tamsulosin (FLOMAX) 0.4 MG CAPS capsule Take by mouth.    [provider]  torsemide (DEMADEX) 20 MG tablet Take 1 tablet (20 mg total) by mouth daily. Do not take until 1/3 or ok by your kidney or primary doctor 01/06/20   Flora Lipps, MD                                                                                                                                    Past Surgical History Past Surgical History:  Procedure Laterality Date   COLON RESECTION     COLON SURGERY     CYSTOSCOPY WITH INSERTION OF UROLIFT     CYSTOSCOPY WITH RETROGRADE PYELOGRAM, URETEROSCOPY AND STENT PLACEMENT Right 12/30/2019   Procedure: CYSTOSCOPY WITH RETROGRADE PYELOGRAM, RIGHT URETERAL STENT PLACEMENT;  Surgeon: Abner Greenspan,  Bonna Gains, MD;  Location: [facility];  Service: Urology;  Laterality: Right;   LITHOTRIPSY     Family History No family history on file.  Social History Social History   Tobacco Use   Smoking status: Never   Smokeless tobacco: Never  Vaping Use   Vaping Use: Never used  Substance Use Topics   Alcohol use: Never   Drug use: Never   Allergies Statins, Sunflower oil, and Tape  Review of Systems Review of Systems  Physical Exam Vital Signs  I have reviewed the triage vital signs BP (!) 154/81   Pulse 76   Temp 97.6 F (36.4 C)   Resp 18   Ht 5' 11"$  (1.803 m)   Wt 76.7 kg   SpO2 99%   BMI 23.57 kg/m  Physical Exam Vitals and nursing note reviewed.  Constitutional:      General: He is not in acute distress.    Appearance: Normal appearance.  HENT:     Head:     Comments: Abrasion to the left upper forehead    Nose: Nose normal.     Comments: Stellate laceration to the bridge of the nose, active bleeding superiorly from small arteriole.  No nosebleed or nasal septal hematoma    Mouth/Throat:     Mouth: Mucous membranes are moist.  Eyes:      Conjunctiva/sclera: Conjunctivae normal.  Cardiovascular:     Rate and Rhythm: Normal rate and regular rhythm.  Pulmonary:     Effort: Pulmonary effort is normal. No respiratory distress.     Breath sounds: Normal breath sounds.  Abdominal:     General: Abdomen is flat.     Palpations: Abdomen is soft.     Tenderness: There is no abdominal tenderness.  Musculoskeletal:     Right lower leg: No edema.     Left lower leg: No edema.     Comments: No midline C, T, L-spine tenderness.  No chest wall tenderness or crepitus.  Full painless range of motion at the bilateral upper extremities including the shoulders, elbows, wrists, hand and fingers, and in the bilateral lower extremities including the hips, knees, ankle, toes.  No focal bony tenderness, injury or deformity.  Skin:    General: Skin is warm and dry.     Capillary Refill: Capillary refill takes less than 2 seconds.     Comments: Abrasion to the right palm  Neurological:     Mental Status: He is alert and oriented to person, place, and time. Mental status is at baseline.  Psychiatric:        Mood and Affect: Mood normal.        Behavior: Behavior normal.     ED Results and Treatments Labs (all labs ordered are listed, but only abnormal results are displayed) Labs Reviewed - No data to display  Radiology DG Hand Complete Left  Result Date: 02/14/2022 CLINICAL DATA:  Left hand pain after fall. EXAM: LEFT HAND - COMPLETE 3+ VIEW COMPARISON:  None Available. FINDINGS: There is no evidence of fracture or dislocation. Moderate degenerative changes seen involving the first carpometacarpal joint as well as the second and third metacarpophalangeal joints. Soft tissues are unremarkable. 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 No acute abnormality seen. Electronically Signed   By:  Marijo Conception M.D.   On: 02/14/2022 11:52   DG Wrist Complete Right  Result Date: 02/14/2022 CLINICAL DATA:  Right wrist pain after fall. EXAM: RIGHT WRIST - COMPLETE 3+ VIEW COMPARISON:  None Available. FINDINGS: There is no evidence of fracture or dislocation. Moderate degenerative joint disease is seen involving the first carpometacarpal joint. Soft tissues are unremarkable. IMPRESSION: Moderate osteoarthritis of first carpometacarpal joint. No acute abnormality seen. Electronically Signed   By: Marijo Conception M.D.   On: 02/14/2022 11:48   CT Head Wo Contrast  Result Date: 02/14/2022 CLINICAL DATA:  Tripped, down stairs. Struck head on concrete. Laceration to nose and forehead. EXAM: CT HEAD WITHOUT CONTRAST CT MAXILLOFACIAL WITHOUT CONTRAST CT CERVICAL SPINE WITHOUT CONTRAST TECHNIQUE: Multidetector CT imaging of the head, cervical spine, and maxillofacial structures were performed using the standard protocol without intravenous contrast. Multiplanar CT image reconstructions of the cervical spine and maxillofacial structures were also generated. RADIATION DOSE REDUCTION: This exam was performed according to the departmental dose-optimization program which includes automated exposure control, adjustment of the mA and/or kV according to patient size and/or use of iterative reconstruction technique. COMPARISON:  Head CT 07/14/2020. FINDINGS: CT HEAD FINDINGS Brain: There is no evidence for acute hemorrhage, hydrocephalus, mass lesion, or abnormal extra-axial fluid collection. No definite CT evidence for acute infarction. Diffuse loss of parenchymal volume is consistent with atrophy. Patchy low attenuation in the deep hemispheric and periventricular white matter is nonspecific, but likely reflects chronic microvascular ischemic demyelination. Vascular: No hyperdense vessel or unexpected calcification. Skull: No evidence for fracture. No worrisome lytic or sclerotic lesion. Other: None. CT MAXILLOFACIAL  FINDINGS Osseous: No fracture or mandibular dislocation. No destructive process. Orbits: Negative. No traumatic or inflammatory finding. Sinuses: Mucosal thickening noted left greater than right maxillary sinuses. Scattered mucosal thickening noted ethmoid sinuses. Frontal in sphenoid sinuses are clear. No mastoid effusion. Soft tissues: Unremarkable. CT CERVICAL SPINE FINDINGS Alignment: Normal. Skull base and vertebrae: No acute fracture. No primary bone lesion or focal pathologic process. Soft tissues and spinal canal: No prevertebral fluid or swelling. No visible canal hematoma. Disc levels: Loss of disc height noted C4-5. Facets are well aligned bilaterally. Upper chest: Unremarkable. Other: None. IMPRESSION: 1. No acute intracranial abnormality. 2. Atrophy with chronic small vessel ischemic disease. 3. No evidence for facial bone fracture. 4. No evidence for cervical spine fracture or subluxation. Electronically Signed   By: Misty Stanley M.D.   On: 02/14/2022 11:33   CT Cervical Spine Wo Contrast  Result Date: 02/14/2022 CLINICAL DATA:  Tripped, down stairs. Struck head on concrete. Laceration to nose and forehead. EXAM: CT HEAD WITHOUT CONTRAST CT MAXILLOFACIAL WITHOUT CONTRAST CT CERVICAL SPINE WITHOUT CONTRAST TECHNIQUE: Multidetector CT imaging of the head, cervical spine, and maxillofacial structures were performed using the standard protocol without intravenous contrast. Multiplanar CT image reconstructions of the cervical spine and maxillofacial structures were also generated. RADIATION DOSE REDUCTION: This exam was performed according to the departmental dose-optimization program which includes automated exposure control, adjustment of the mA and/or kV  according to patient size and/or use of iterative reconstruction technique. COMPARISON:  Head CT 07/14/2020. FINDINGS: CT HEAD FINDINGS Brain: There is no evidence for acute hemorrhage, hydrocephalus, mass lesion, or abnormal extra-axial fluid  collection. No definite CT evidence for acute infarction. Diffuse loss of parenchymal volume is consistent with atrophy. Patchy low attenuation in the deep hemispheric and periventricular white matter is nonspecific, but likely reflects chronic microvascular ischemic demyelination. Vascular: No hyperdense vessel or unexpected calcification. Skull: No evidence for fracture. No worrisome lytic or sclerotic lesion. Other: None. CT MAXILLOFACIAL FINDINGS Osseous: No fracture or mandibular dislocation. No destructive process. Orbits: Negative. No traumatic or inflammatory finding. Sinuses: Mucosal thickening noted left greater than right maxillary sinuses. Scattered mucosal thickening noted ethmoid sinuses. Frontal in sphenoid sinuses are clear. No mastoid effusion. Soft tissues: Unremarkable. CT CERVICAL SPINE FINDINGS Alignment: Normal. Skull base and vertebrae: No acute fracture. No primary bone lesion or focal pathologic process. Soft tissues and spinal canal: No prevertebral fluid or swelling. No visible canal hematoma. Disc levels: Loss of disc height noted C4-5. Facets are well aligned bilaterally. Upper chest: Unremarkable. Other: None. IMPRESSION: 1. No acute intracranial abnormality. 2. Atrophy with chronic small vessel ischemic disease. 3. No evidence for facial bone fracture. 4. No evidence for cervical spine fracture or subluxation. Electronically Signed   By: Misty Stanley M.D.   On: 02/14/2022 11:33   CT Maxillofacial WO CM  Result Date: 02/14/2022 CLINICAL DATA:  Tripped, down stairs. Struck head on concrete. Laceration to nose and forehead. EXAM: CT HEAD WITHOUT CONTRAST CT MAXILLOFACIAL WITHOUT CONTRAST CT CERVICAL SPINE WITHOUT CONTRAST TECHNIQUE: Multidetector CT imaging of the head, cervical spine, and maxillofacial structures were performed using the standard protocol without intravenous contrast. Multiplanar CT image reconstructions of the cervical spine and maxillofacial structures were also  generated. RADIATION DOSE REDUCTION: This exam was performed according to the departmental dose-optimization program which includes automated exposure control, adjustment of the mA and/or kV according to patient size and/or use of iterative reconstruction technique. COMPARISON:  Head CT 07/14/2020. FINDINGS: CT HEAD FINDINGS Brain: There is no evidence for acute hemorrhage, hydrocephalus, mass lesion, or abnormal extra-axial fluid collection. No definite CT evidence for acute infarction. Diffuse loss of parenchymal volume is consistent with atrophy. Patchy low attenuation in the deep hemispheric and periventricular white matter is nonspecific, but likely reflects chronic microvascular ischemic demyelination. Vascular: No hyperdense vessel or unexpected calcification. Skull: No evidence for fracture. No worrisome lytic or sclerotic lesion. Other: None. CT MAXILLOFACIAL FINDINGS Osseous: No fracture or mandibular dislocation. No destructive process. Orbits: Negative. No traumatic or inflammatory finding. Sinuses: Mucosal thickening noted left greater than right maxillary sinuses. Scattered mucosal thickening noted ethmoid sinuses. Frontal in sphenoid sinuses are clear. No mastoid effusion. Soft tissues: Unremarkable. CT CERVICAL SPINE FINDINGS Alignment: Normal. Skull base and vertebrae: No acute fracture. No primary bone lesion or focal pathologic process. Soft tissues and spinal canal: No prevertebral fluid or swelling. No visible canal hematoma. Disc levels: Loss of disc height noted C4-5. Facets are well aligned bilaterally. Upper chest: Unremarkable. Other: None. IMPRESSION: 1. No acute intracranial abnormality. 2. Atrophy with chronic small vessel ischemic disease. 3. No evidence for facial bone fracture. 4. No evidence for cervical spine fracture or subluxation. Electronically Signed   By: Misty Stanley M.D.   On: 02/14/2022 11:33    Pertinent labs & imaging results that were available during my care of the  patient were reviewed by me and considered in my medical decision making (see  MDM for details).  Medications Ordered in ED Medications  lidocaine (XYLOCAINE) 1 % (with pres) injection (has no administration in time range)                                                                                                                                     Procedures .Marland KitchenLaceration Repair  Date/Time: 02/14/2022 11:43 AM  Performed by: Cristie Hem, MD Authorized by: Cristie Hem, MD   Consent:    Consent obtained:  Verbal   Consent given by:  Patient   Risks, benefits, and alternatives were discussed: yes     Risks discussed:  Pain, infection, need for additional repair, poor cosmetic result, poor wound healing and vascular damage   Alternatives discussed:  No treatment Universal protocol:    Procedure explained and questions answered to patient or proxy's satisfaction: yes     Patient identity confirmed:  Verbally with patient and arm band Anesthesia:    Anesthesia method:  Local infiltration   Local anesthetic:  Lidocaine 1% w/o epi Laceration details:    Location:  Face   Face location:  Nose   Length (cm):  2.6 Exploration:    Hemostasis achieved with:  Cautery (very small arterial bleed)   Wound exploration: wound explored through full range of motion and entire depth of wound visualized     Wound extent: fascia not violated, no underlying fracture and no vascular damage     Contaminated: no   Treatment:    Area cleansed with:  Saline   Amount of cleaning:  Standard   Irrigation solution:  Sterile saline   Irrigation method:  Syringe   Visualized foreign bodies/material removed: no     Debridement:  None   Undermining:  None   Scar revision: no   Skin repair:    Repair method:  Sutures   Suture size:  5-0   Suture material:  Prolene   Suture technique:  Simple interrupted   Number of sutures:  5 Approximation:    Approximation:  Close Repair type:     Repair type:  Complex (required cautery and control of active bleeding) Post-procedure details:    Dressing:  Antibiotic ointment and non-adherent dressing   Procedure completion:  Tolerated well, no immediate complications   (including critical care time)  Medical Decision Making / ED Course   MDM:  82 year old male presenting to the emergency department with fall onto nose.  Patient well-appearing, physical exam with abrasion to face and laceration of the nose.  Laceration to nose actively bleeding, controlled with Bovie application to small arteriolar bleed.  Wound repaired.  See procedure note for details.  Patient reports his tetanus is up-to-date.  Low concern for other dangerous process from fall.  CT face, CT head and neck negative for acute fracture.  No evidence of other musculoskeletal injury other than abrasion to right palm, given pain will pain x-ray although  range of motion intact, no snuffbox tenderness.  Patient has pain to the left middle finger but has full range of motion of the left middle finger with no focal tenderness, will obtain x-ray but low concern for fracture.If X-rays negative anticipate discharge.   Clinical Course as of 02/14/22 1214  Mon Feb 14, 2022  1212 X-rays negative for fractures in the left third finger or the right wrist. Will discharge patient to home. All questions answered. Patient comfortable with plan of discharge. Return precautions discussed with patient and specified on the after visit summary. [WS]    Clinical Course User Index [WS] Cristie Hem, MD     Additional history obtained: -Additional history obtained from spouse -External records from outside source obtained and reviewed including: Chart review including previous notes, labs, imaging, consultation notes including recevied TDAP 07/15/20      Imaging Studies ordered: I ordered imaging studies including CT head and neck., CT cervical spine, XR right wrist, XR left  hand On my interpretation imaging demonstrates no fractures/bleeding I independently visualized and interpreted imaging. I agree with the radiologist interpretation   Medicines ordered and prescription drug management: Meds ordered this encounter  Medications   lidocaine (XYLOCAINE) 1 % (with pres) injection    Roughgarden, Cynthia: cabinet override    -I have reviewed the patients home medicines and have made adjustments as needed   Social Determinants of Health:  Diagnosis or treatment significantly limited by social determinants of health: current smoker   Reevaluation: After the interventions noted above, I reevaluated the patient and found that they have improved  Co morbidities that complicate the patient evaluation  Past Medical History:  Diagnosis Date   Diverticulitis    Posterior vitreous detachment of left eye 01/12/2021   Renal disorder    kidney stones      Dispostion: Disposition decision including need for hospitalization was considered, and patient discharged from emergency department.    Final Clinical Impression(s) / ED Diagnoses Final diagnoses:  Laceration of nose, initial encounter     This chart was dictated using voice recognition software.  Despite best efforts to proofread,  errors can occur which can change the documentation meaning.    Cristie Hem, MD 02/14/22 1214

## 2022-02-14 NOTE — Discharge Instructions (Addendum)
We evaluated you for your fall.  Your CT scans did not show any sign of bleeding in your brain or fractures.  Your x-rays did not show any fractures in your hands.  We repaired your nose laceration.  Please have the sutures removed in approximately 5 to 7 days by your primary physician.  You can also return to the emergency department or go to urgent care if you are unable to see your physician in time.  Please be careful when going downstairs to avoid future falls.

## 2022-02-14 NOTE — ED Notes (Signed)
Pt states missed the last step of the stairs and fell striking his face , has chunk out of top of nose that is oozing pretty well and abrasion to rt forehead, abrasions to rt hand and palm and left ring finger is hurting him , has abrasions to left knee , denies swallowing blood or dizziness is on no n blood thinners but pt is doing P Dialysis ,still has permcath rt upper chest pt states that is coming out Dr  Truett Mainland at bedside to suture small bleed , did use bovie on one bleeder

## 2022-02-14 NOTE — ED Triage Notes (Addendum)
States missed the last step coming down the stairs, hit head on concrete. Laceration to nose and forehead. Laceration on nose bleeding during triage, pressure applied with gauze. Denies LOC/thinner use.  On home peritoneal dialysis

## 2022-11-28 IMAGING — US US RENAL
1 series · 14 of 25 positions shown · non-contrast
Comparison: 12/29/2019

CLINICAL DATA: Recent obstructing right UVJ calculus with right
hydronephrosis. Status post ureteroscopy and right stent placement

EXAM:
RENAL / URINARY TRACT ULTRASOUND COMPLETE

[Series 1: us renal · 14 of 37 slices shown]
[im 1/37]
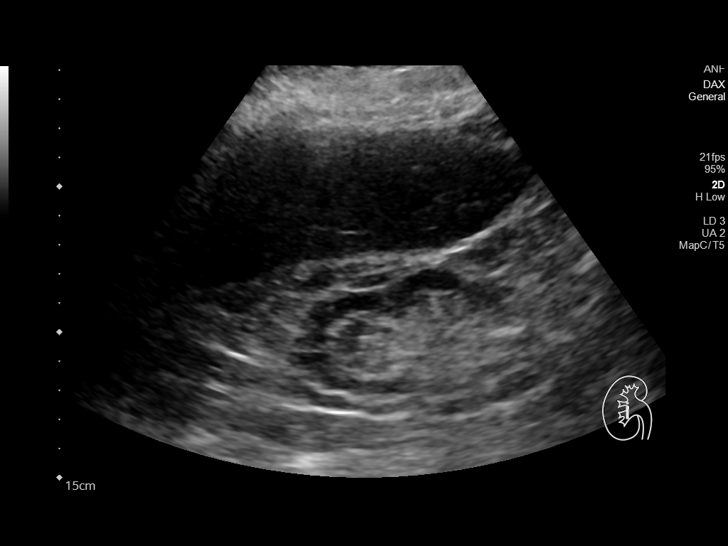
[im 4/37]
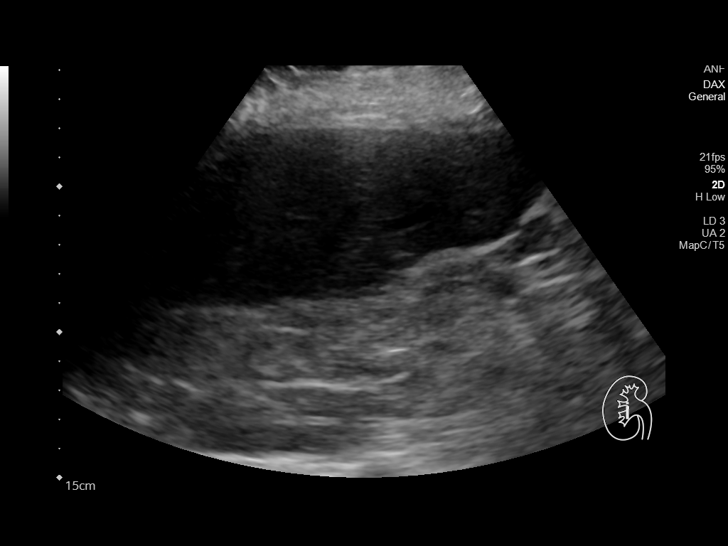
[im 7/37]
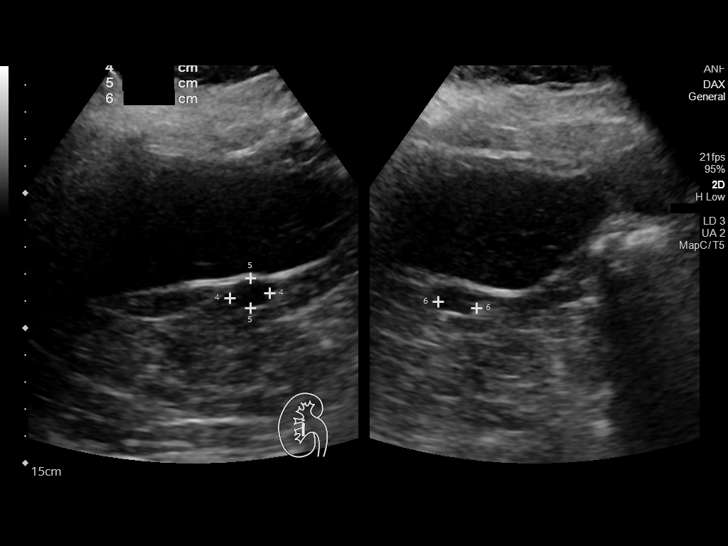
[im 10/37]
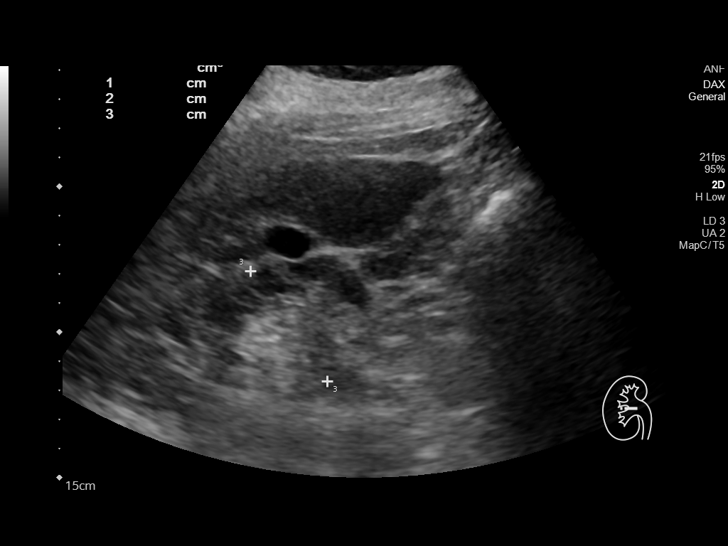
[im 13/37]
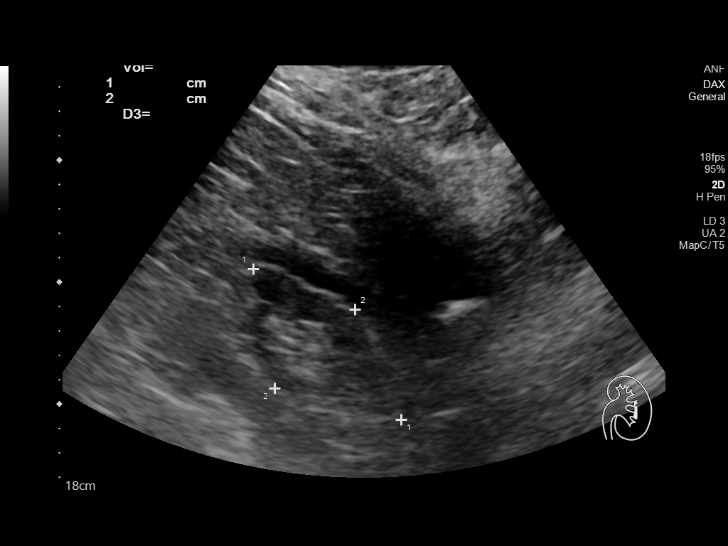
[im 14/37]
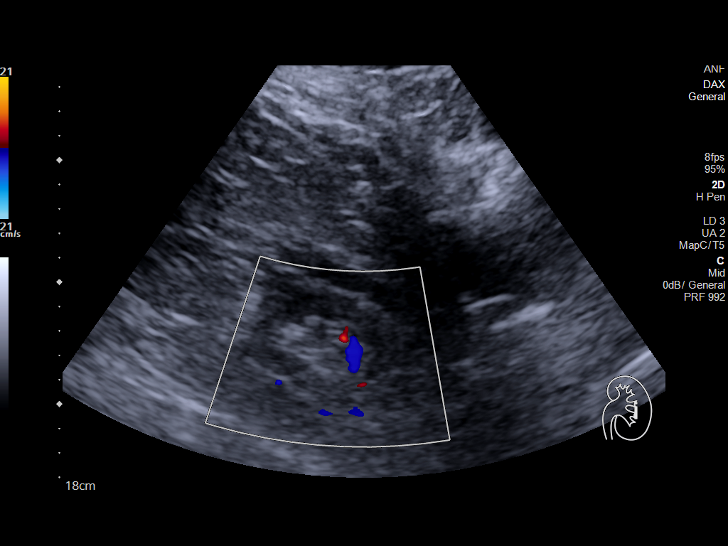
[im 17/37]
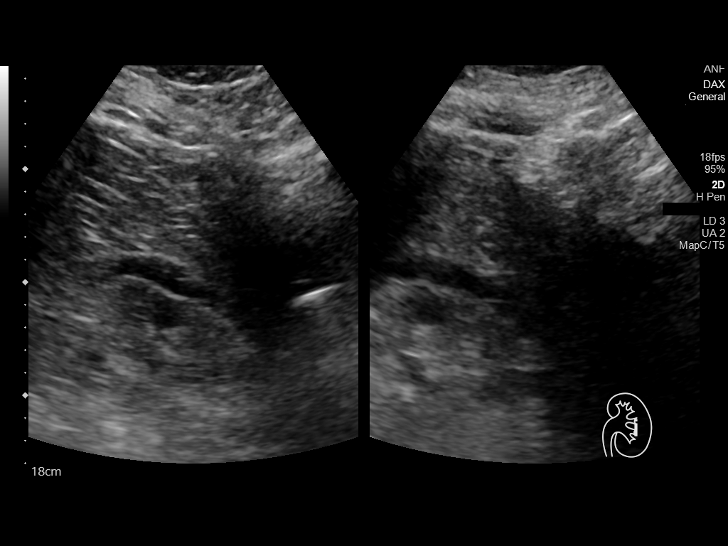
[im 20/37]
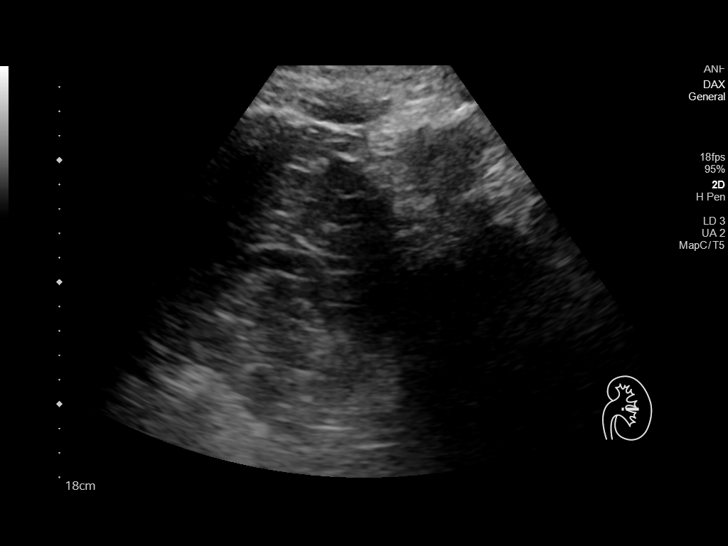
[im 23/37]
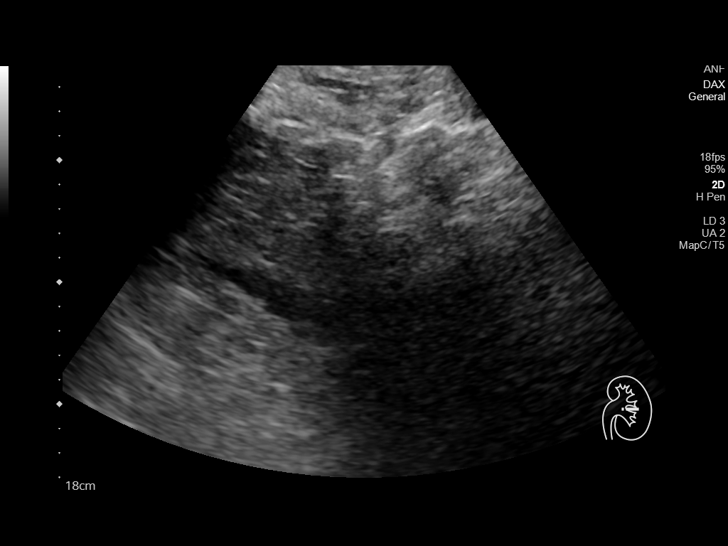
[im 25/37]
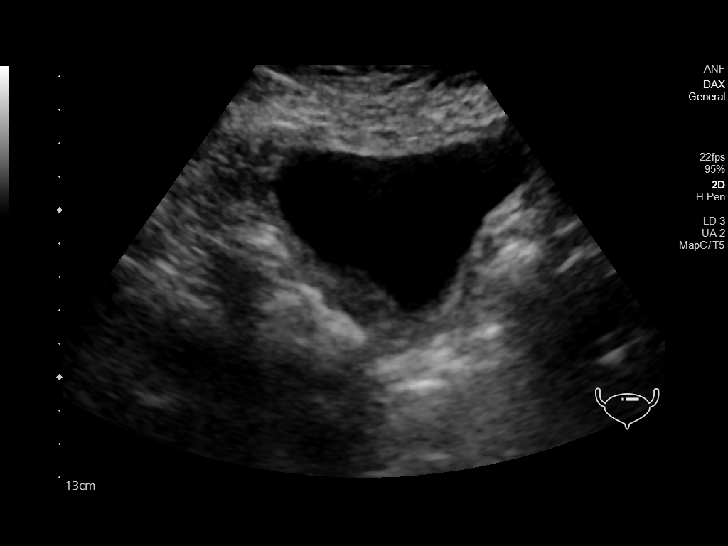
[im 28/37]
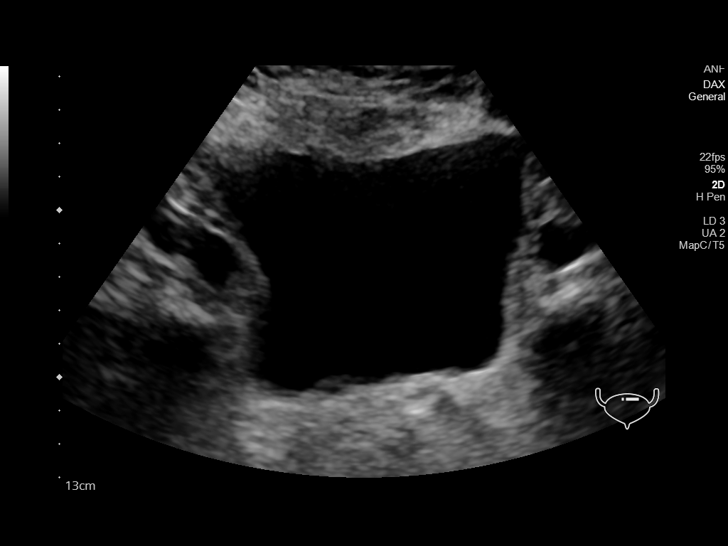
[im 31/37]
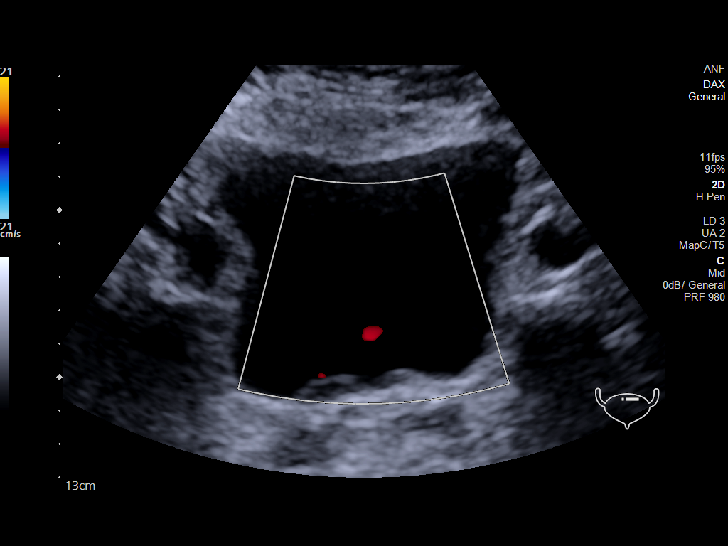
[im 34/37]
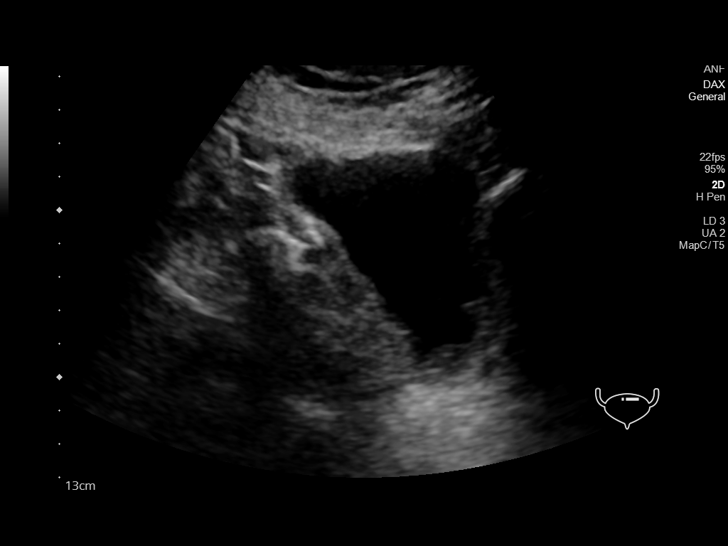
[im 37/37]
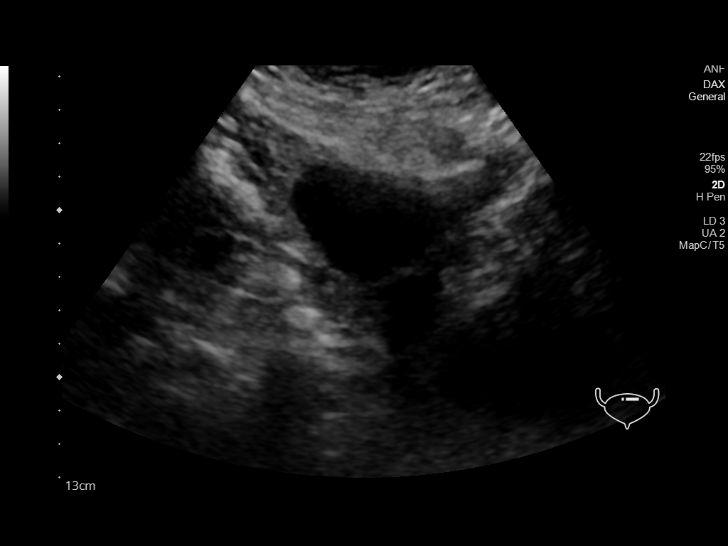

[14 of 25 positions shown; findings below may reference images not displayed]

FINDINGS: Right Kidney:

Renal measurements: 9.2 x 4.3 x 4.6 cm = volume: 96.8 mL. Cortical
scarring/thinning. No hydronephrosis. Increased echogenicity
compatible with medical renal disease. Largest midpole cortical cyst
measures 1.5 cm in greatest diameter.

Left Kidney:

Renal measurements: 8.7 x 4.6 x 5.1 cm = volume: 107 mL. Similar
cortical thinning and increased echogenicity compatible with chronic
medical renal disease. Largest upper pole cortical cyst measures
cm. No hydronephrosis.

Bladder:

Ureteral jets demonstrated bilaterally.

Other:

None.
IMPRESSION: Negative for hydronephrosis.

Renal atrophy and increased echogenicity compatible with chronic
medical renal disease

Incidental bilateral cortical cysts.

## 2023-06-11 IMAGING — CT CT HEAD W/O CM
3 series · 14 of 47 positions shown, 16 images · non-contrast
Comparison: MRI 06/13/2019

CLINICAL DATA: Fall, supraorbital laceration on the right. A brace
into the right face

EXAM:
CT HEAD WITHOUT CONTRAST
TECHNIQUE: Contiguous axial images were obtained from the base of the skull
through the vertex without intravenous contrast.

[Series 2: head wo · axial · 0.49mm/px · z∈[-71,+69]mm · 8 of 34 slices shown, 10 images]
[im 3/34  brain]
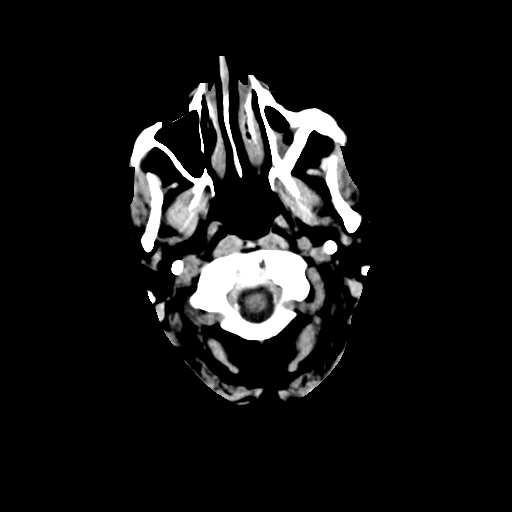
[im 3/34  bone]
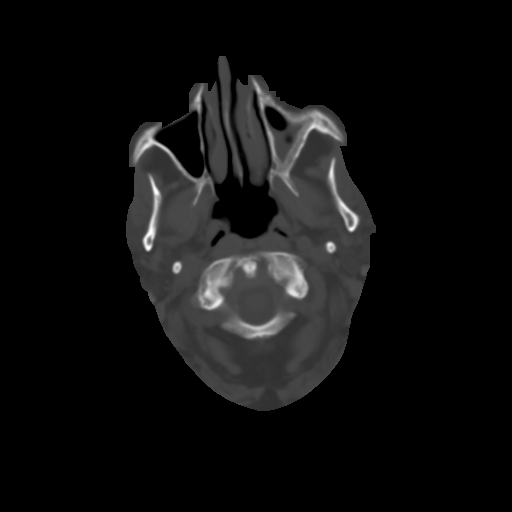
[im 7/34  brain]
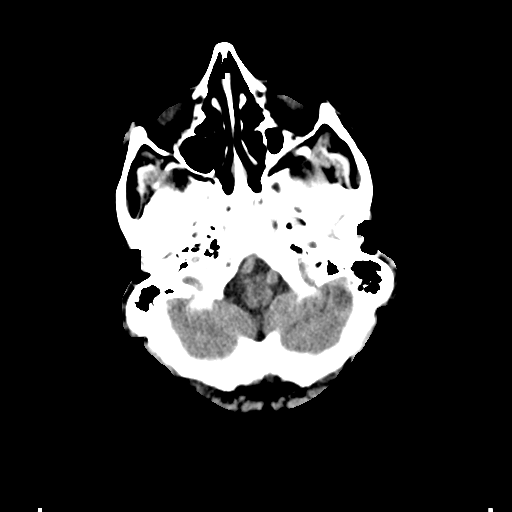
[im 11/34  brain]
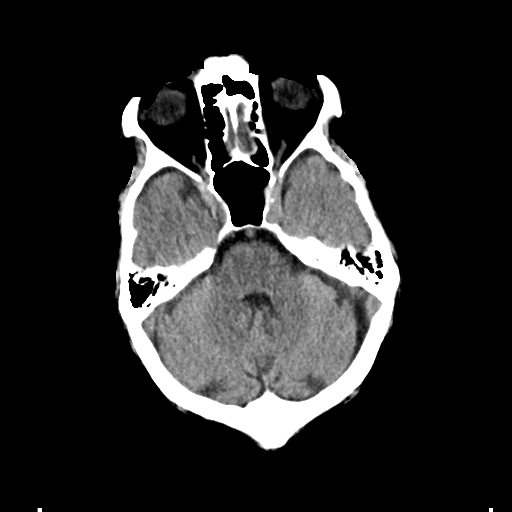
[im 15/34  brain]
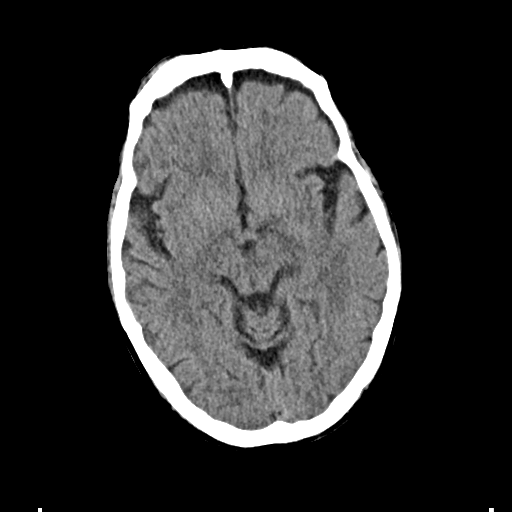
[im 19/34  brain]
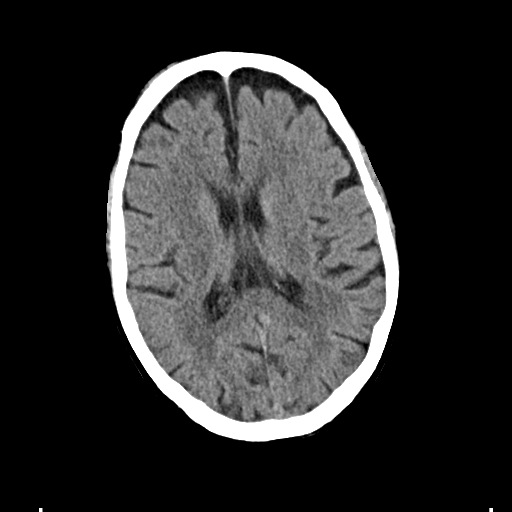
[im 19/34  bone]
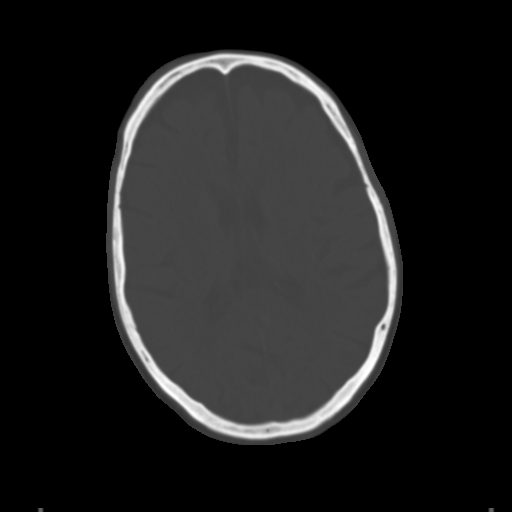
[im 23/34  brain]
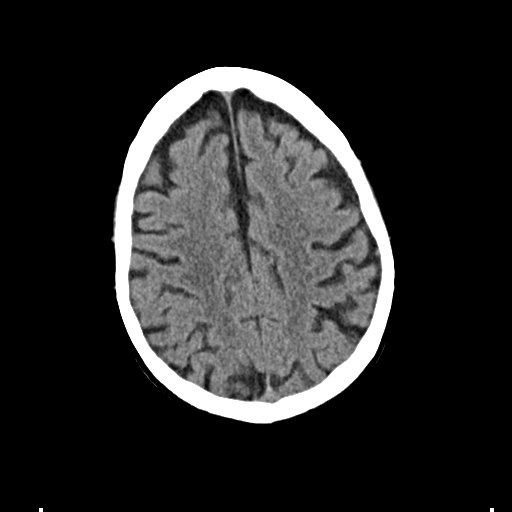
[im 27/34  brain]
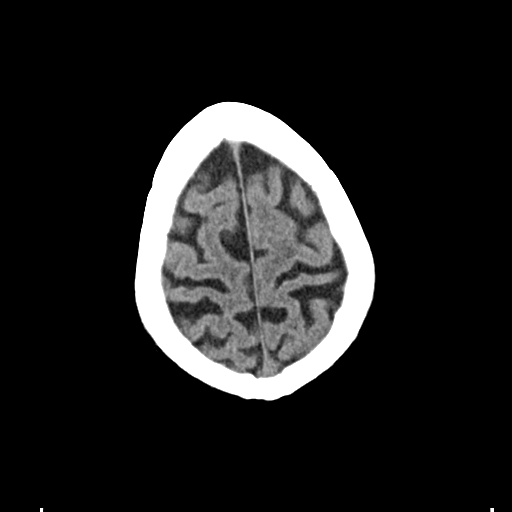
[im 31/34  brain]
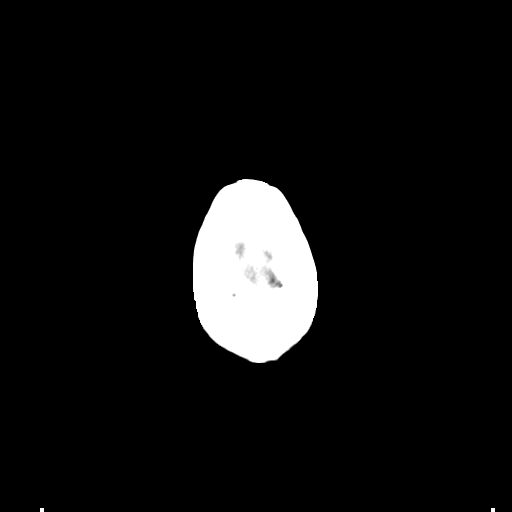

[Series 4: coronal soft · coronal · 0.32mm/px · 3 of 73 slices shown]
[im 25/73  brain]
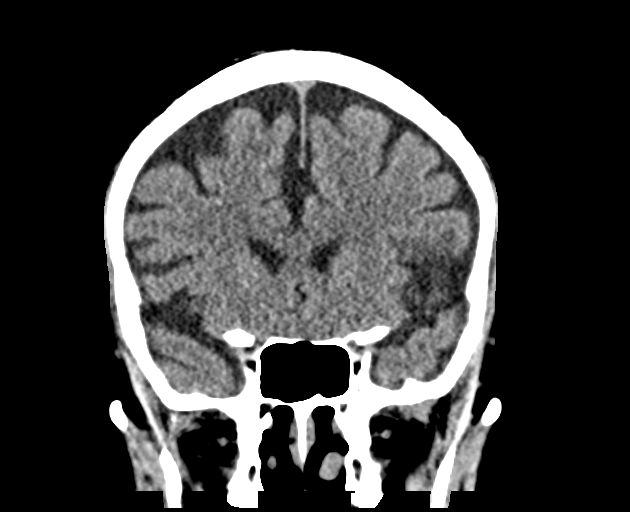
[im 33/73  brain]
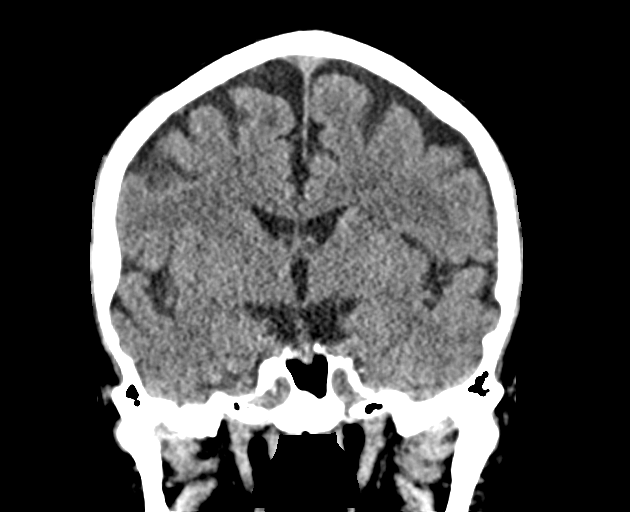
[im 41/73  brain]
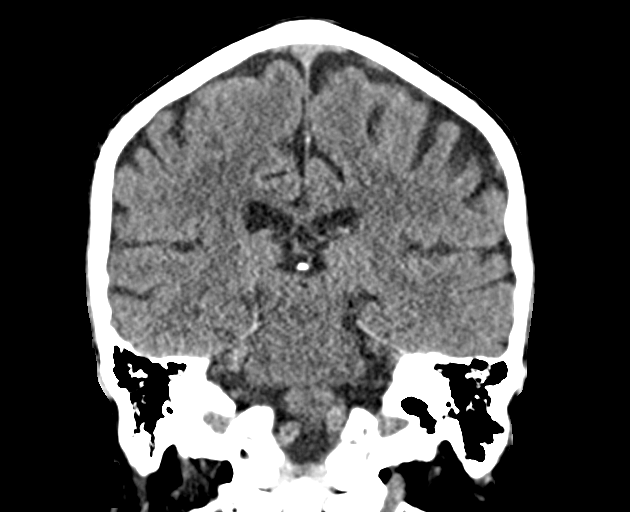

[Series 5: sag soft · sagittal · 0.32mm/px · 3 of 66 slices shown]
[im 22/66  brain]
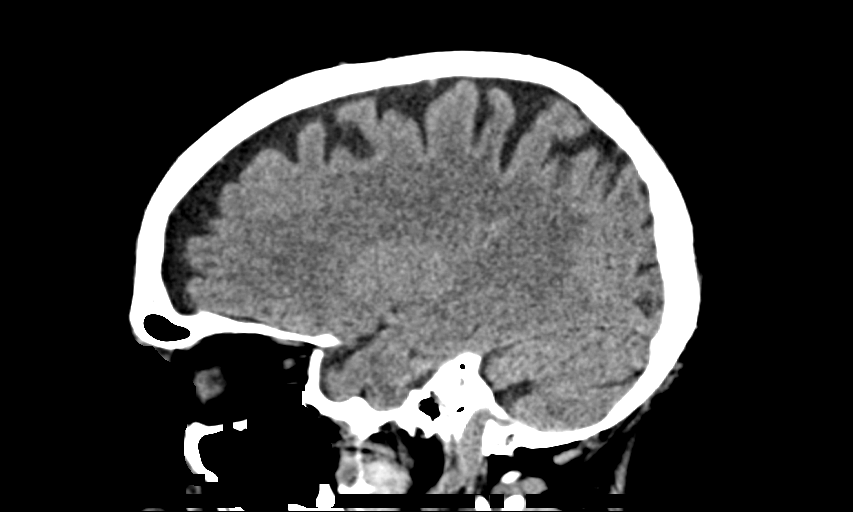
[im 33/66  brain]
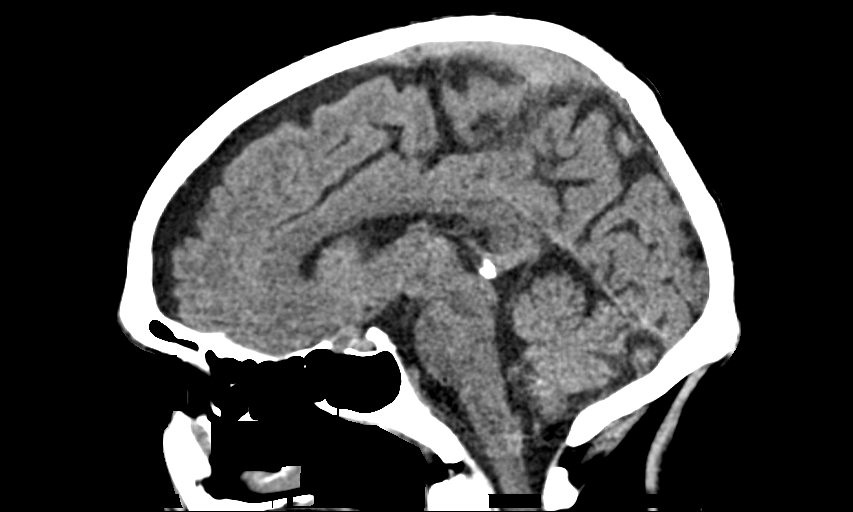
[im 44/66  brain]
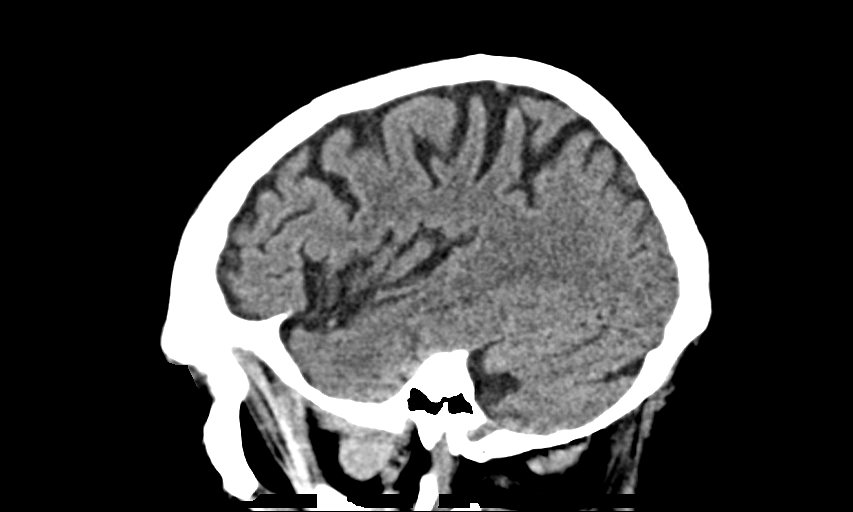

[14 of 47 positions shown; findings below may reference images not displayed]

FINDINGS: Brain: Focus of hypoattenuation in the right basal ganglia
suggesting prior lacunar type infarct or prominent perivascular
space. Comparison MRI demonstrates a 4 mm nodule of the right
internal auditory canal, not well visualized on CT imaging. No
evidence of acute infarction, hemorrhage, hydrocephalus, extra-axial
collection, other visible mass lesion or mass effect. Symmetric
prominence of the ventricles, cisterns and sulci compatible with
parenchymal volume loss. Patchy areas of white matter
hypoattenuation are most compatible with chronic microvascular
angiopathy.

Vascular: Atherosclerotic calcification of the carotid siphons and
intradural vertebral arteries. No hyperdense vessel.

Skull: Right supraorbital scalp laceration with crescentic soft
tissue hematoma measuring up to 4 mm in maximal thickness. Few
punctate radiodensities within the site of laceration could reflect
radiodense debris. No visible calvarial fracture or sutural
diastasis. No discernible facial bone fracture within the margins of
imaging.

Sinuses/Orbits: Thickening and secretions in the left maxillary
sinus with hyperostotic changes and atelectatic features. Remaining
paranasal sinuses and mastoid air cells are clear. Middle ear
cavities are clear.

Other: None.
IMPRESSION: 1. Right frontal/supraorbital scalp laceration and swelling with
punctate radiodensities concerning for debris at the site of
laceration. Overlying bandaging is present. No calvarial or visible
facial bone fracture.
2. Suspected right vestibular schwannoma is not well visualized on
the CT images.
3. No acute intracranial abnormality.
4. Background of parenchymal volume loss and microvascular
angiopathy.
5. Likely remote lacunar infarct in the right basal ganglia.
6. Intracranial atherosclerosis.
